# Patient Record
Sex: Female | Born: 1945 | Race: Black or African American | Hispanic: No | Smoking: Never smoker
Health system: Southern US, Community
[De-identification: ages and names within clinical notes are randomized; demographics above are authoritative.]

## PROBLEM LIST (undated history)

## (undated) DIAGNOSIS — E119 Type 2 diabetes mellitus without complications: Secondary | ICD-10-CM

## (undated) DIAGNOSIS — I10 Essential (primary) hypertension: Secondary | ICD-10-CM

## (undated) DIAGNOSIS — M109 Gout, unspecified: Secondary | ICD-10-CM

## (undated) HISTORY — PX: BACK SURGERY: SHX140

## (undated) HISTORY — PX: ABDOMINAL HYSTERECTOMY: SHX81

## (undated) HISTORY — PX: OTHER SURGICAL HISTORY: SHX169

## (undated) HISTORY — PX: THYROID SURGERY: SHX805

## (undated) HISTORY — PX: CHOLECYSTECTOMY: SHX55

---

## 2015-10-21 ENCOUNTER — Encounter (HOSPITAL_COMMUNITY): Payer: Self-pay | Admitting: Family Medicine

## 2015-10-21 ENCOUNTER — Inpatient Hospital Stay (HOSPITAL_COMMUNITY)
Admission: EM | Admit: 2015-10-21 | Discharge: 2015-10-28 | DRG: 871 | Disposition: A | Discharge status: Hospice | Payer: Medicare Other | Attending: Family Medicine | Admitting: Family Medicine

## 2015-10-21 DIAGNOSIS — A419 Sepsis, unspecified organism: Secondary | ICD-10-CM | POA: Diagnosis not present

## 2015-10-21 DIAGNOSIS — Z7982 Long term (current) use of aspirin: Secondary | ICD-10-CM

## 2015-10-21 DIAGNOSIS — M109 Gout, unspecified: Secondary | ICD-10-CM | POA: Diagnosis present

## 2015-10-21 DIAGNOSIS — K5900 Constipation, unspecified: Secondary | ICD-10-CM | POA: Diagnosis present

## 2015-10-21 DIAGNOSIS — Z880 Allergy status to penicillin: Secondary | ICD-10-CM

## 2015-10-21 DIAGNOSIS — E872 Acidosis: Secondary | ICD-10-CM | POA: Diagnosis present

## 2015-10-21 DIAGNOSIS — E876 Hypokalemia: Secondary | ICD-10-CM | POA: Diagnosis present

## 2015-10-21 DIAGNOSIS — L89154 Pressure ulcer of sacral region, stage 4: Secondary | ICD-10-CM | POA: Diagnosis present

## 2015-10-21 DIAGNOSIS — L89159 Pressure ulcer of sacral region, unspecified stage: Secondary | ICD-10-CM | POA: Diagnosis present

## 2015-10-21 DIAGNOSIS — Z66 Do not resuscitate: Secondary | ICD-10-CM | POA: Diagnosis present

## 2015-10-21 DIAGNOSIS — N390 Urinary tract infection, site not specified: Secondary | ICD-10-CM | POA: Diagnosis present

## 2015-10-21 DIAGNOSIS — R6251 Failure to thrive (child): Secondary | ICD-10-CM

## 2015-10-21 DIAGNOSIS — Z8673 Personal history of transient ischemic attack (TIA), and cerebral infarction without residual deficits: Secondary | ICD-10-CM

## 2015-10-21 DIAGNOSIS — Z794 Long term (current) use of insulin: Secondary | ICD-10-CM

## 2015-10-21 DIAGNOSIS — I10 Essential (primary) hypertension: Secondary | ICD-10-CM | POA: Diagnosis present

## 2015-10-21 DIAGNOSIS — Z515 Encounter for palliative care: Secondary | ICD-10-CM | POA: Insufficient documentation

## 2015-10-21 DIAGNOSIS — F0280 Dementia in other diseases classified elsewhere without behavioral disturbance: Secondary | ICD-10-CM | POA: Diagnosis present

## 2015-10-21 DIAGNOSIS — B9620 Unspecified Escherichia coli [E. coli] as the cause of diseases classified elsewhere: Secondary | ICD-10-CM | POA: Diagnosis present

## 2015-10-21 DIAGNOSIS — E11649 Type 2 diabetes mellitus with hypoglycemia without coma: Secondary | ICD-10-CM | POA: Diagnosis present

## 2015-10-21 DIAGNOSIS — Z91040 Latex allergy status: Secondary | ICD-10-CM

## 2015-10-21 DIAGNOSIS — Z955 Presence of coronary angioplasty implant and graft: Secondary | ICD-10-CM

## 2015-10-21 DIAGNOSIS — Z7401 Bed confinement status: Secondary | ICD-10-CM

## 2015-10-21 DIAGNOSIS — Z1619 Resistance to other specified beta lactam antibiotics: Secondary | ICD-10-CM | POA: Diagnosis present

## 2015-10-21 DIAGNOSIS — Z885 Allergy status to narcotic agent status: Secondary | ICD-10-CM

## 2015-10-21 DIAGNOSIS — L8994 Pressure ulcer of unspecified site, stage 4: Secondary | ICD-10-CM

## 2015-10-21 DIAGNOSIS — N179 Acute kidney failure, unspecified: Secondary | ICD-10-CM | POA: Diagnosis present

## 2015-10-21 DIAGNOSIS — G3109 Other frontotemporal dementia: Secondary | ICD-10-CM | POA: Diagnosis present

## 2015-10-21 DIAGNOSIS — I2510 Atherosclerotic heart disease of native coronary artery without angina pectoris: Secondary | ICD-10-CM | POA: Diagnosis present

## 2015-10-21 DIAGNOSIS — L98429 Non-pressure chronic ulcer of back with unspecified severity: Secondary | ICD-10-CM

## 2015-10-21 DIAGNOSIS — L89629 Pressure ulcer of left heel, unspecified stage: Secondary | ICD-10-CM | POA: Diagnosis present

## 2015-10-21 DIAGNOSIS — D649 Anemia, unspecified: Secondary | ICD-10-CM | POA: Diagnosis present

## 2015-10-21 DIAGNOSIS — R627 Adult failure to thrive: Secondary | ICD-10-CM | POA: Diagnosis present

## 2015-10-21 DIAGNOSIS — Z79899 Other long term (current) drug therapy: Secondary | ICD-10-CM

## 2015-10-21 DIAGNOSIS — E119 Type 2 diabetes mellitus without complications: Secondary | ICD-10-CM

## 2015-10-21 HISTORY — DX: Type 2 diabetes mellitus without complications: E11.9

## 2015-10-21 HISTORY — DX: Essential (primary) hypertension: I10

## 2015-10-21 HISTORY — DX: Gout, unspecified: M10.9

## 2015-10-21 LAB — COMPREHENSIVE METABOLIC PANEL
ALBUMIN: 2.7 g/dL — AB (ref 3.5–5.0)
ALK PHOS: 98 U/L (ref 38–126)
ALT: 22 U/L (ref 14–54)
AST: 31 U/L (ref 15–41)
Anion gap: 15 (ref 5–15)
BUN: 11 mg/dL (ref 6–20)
CALCIUM: 9.3 mg/dL (ref 8.9–10.3)
CHLORIDE: 98 mmol/L — AB (ref 101–111)
CO2: 24 mmol/L (ref 22–32)
CREATININE: 1.2 mg/dL — AB (ref 0.44–1.00)
GFR calc non Af Amer: 44 mL/min — ABNORMAL LOW (ref >60)
GFR, EST AFRICAN AMERICAN: 51 mL/min — AB (ref >60)
GLUCOSE: 197 mg/dL — AB (ref 65–99)
Potassium: 3.7 mmol/L (ref 3.5–5.1)
SODIUM: 137 mmol/L (ref 135–145)
Total Bilirubin: 0.8 mg/dL (ref 0.3–1.2)
Total Protein: 6.9 g/dL (ref 6.5–8.1)

## 2015-10-21 LAB — CBC WITH DIFFERENTIAL/PLATELET
Basophils Absolute: 0.1 10*3/uL (ref 0.0–0.1)
Basophils Relative: 0 %
Eosinophils Absolute: 0.1 10*3/uL (ref 0.0–0.7)
Eosinophils Relative: 1 %
HEMATOCRIT: 41.2 % (ref 36.0–46.0)
Hemoglobin: 12.9 g/dL (ref 12.0–15.0)
LYMPHS PCT: 19 %
Lymphs Abs: 3 10*3/uL (ref 0.7–4.0)
MCH: 24.9 pg — ABNORMAL LOW (ref 26.0–34.0)
MCHC: 31.3 g/dL (ref 30.0–36.0)
MCV: 79.4 fL (ref 78.0–100.0)
MONO ABS: 1.3 10*3/uL — AB (ref 0.1–1.0)
MONOS PCT: 8 %
NEUTROS ABS: 11.2 10*3/uL — AB (ref 1.7–7.7)
Neutrophils Relative %: 72 %
PLATELETS: 466 10*3/uL — AB (ref 150–400)
RBC: 5.19 MIL/uL — ABNORMAL HIGH (ref 3.87–5.11)
RDW: 18.4 % — ABNORMAL HIGH (ref 11.5–15.5)
WBC: 15.6 10*3/uL — ABNORMAL HIGH (ref 4.0–10.5)

## 2015-10-21 MED ORDER — SODIUM CHLORIDE 0.9 % IV SOLN
Freq: Once | INTRAVENOUS | Status: AC
  Administered 2015-10-22: 02:00:00 via INTRAVENOUS

## 2015-10-21 NOTE — ED Notes (Addendum)
Pt here for ulcer to sacrum area. sts oozing, bleeding and foul smell. sts worsening over the past month. sts to the bone. Pt does not speak and hasn't x 8 months. Per daughter unsure if she has had a stroke or Alzheimer. sts she says random words. Pt grunting.

## 2015-10-21 NOTE — ED Provider Notes (Signed)
CSN: [id number]     Arrival date & time 10/21/15  1756 History  By signing my name below, I, Lisa Burch, attest that this documentation has been prepared under the direction and in the presence of Shon Batonourtney F Horton, MD. Electronically Signed: Bethel BornBritney Burch, ED Scribe. 10/22/2015. 1:10 AM Chief Complaint  Patient presents with  . Skin Ulcer   Level V caveat secondary to the condition of the patient   The history is provided by a relative. The history is limited by the condition of the patient. No language interpreter was used.   Lisa Burch is a 70 y.o. female with history of heart disease, DM, and HTN who presents to the Emergency Department complaining of a worsening ulcer at the sacrum with onset 09/22/15.  The pt has been bed bound for the last 8 months and also has ulcers at her right lower leg and left heel. The pt has not been seen by wound care and does not have home health care. She was seen by her PCP and referred to the ED. Daughter denies fever or urinary symptoms. The pt is non-verbal at baseline, her family states that it is unknown if she has dementia but she does have a "frontal lobe problem".   PCP:  Iva LentoJamie Stapp Young  Past Medical History  Diagnosis Date  . Hypertension   . Diabetes mellitus without complication (HCC)   . Gout    Past Surgical History  Procedure Laterality Date  . Thyroid surgery    . Abdominal hysterectomy    . Back surgery     History reviewed. No pertinent family history. Social History  Substance Use Topics  . Smoking status: Never Smoker   . Smokeless tobacco: None  . Alcohol Use: None   OB History    No data available     Review of Systems  Unable to perform ROS: Patient nonverbal   Allergies  Latex; Morphine and related; and Penicillins  Home Medications   Prior to Admission medications   Medication Sig Start Date End Date Taking? Authorizing Provider  allopurinol (ZYLOPRIM) 100 MG tablet Take 100 mg by mouth daily.    Yes Historical Provider, MD  ALPRAZolam Prudy Feeler(XANAX) 0.5 MG tablet Take 0.5 mg by mouth as needed for anxiety.   Yes Historical Provider, MD  aspirin 81 MG chewable tablet Chew 81 mg by mouth daily.   Yes Historical Provider, MD  carvedilol (COREG) 6.25 MG tablet Take 6.25 mg by mouth 2 (two) times daily with a meal.   Yes Historical Provider, MD  cetirizine (ZYRTEC) 10 MG tablet Take 10 mg by mouth daily.   Yes Historical Provider, MD  clotrimazole-betamethasone (LOTRISONE) cream Apply 1 application topically 2 (two) times daily.   Yes Historical Provider, MD  colchicine 0.6 MG tablet Take 0.6 mg by mouth daily.   Yes Historical Provider, MD  cyanocobalamin 500 MCG tablet Take 500 mcg by mouth daily.   Yes Historical Provider, MD  docusate sodium (COLACE) 100 MG capsule Take 100 mg by mouth daily.   Yes Historical Provider, MD  HYDROcodone-acetaminophen (NORCO) 7.5-325 MG tablet Take 1 tablet by mouth every 6 (six) hours.   Yes Historical Provider, MD  insulin detemir (LEVEMIR) 100 UNIT/ML injection Inject 40 Units into the skin 2 (two) times daily.   Yes Historical Provider, MD  insulin regular (NOVOLIN R,HUMULIN R) 100 units/mL injection Inject 1-15 Units into the skin 3 (three) times daily before meals. Sliding scale   Yes Historical Provider, MD  isosorbide mononitrate (IMDUR) 30 MG 24 hr tablet Take 30 mg by mouth daily.   Yes Historical Provider, MD  Multiple Vitamin (MULTIVITAMIN WITH MINERALS) TABS tablet Take 1 tablet by mouth daily.   Yes Historical Provider, MD  Omega-3 Fatty Acids (FISH OIL) 1000 MG CAPS Take 1,000 mg by mouth daily.   Yes Historical Provider, MD  Vitamin D, Ergocalciferol, (DRISDOL) 50000 units CAPS capsule Take 50,000 Units by mouth every 7 (seven) days. On mondays   Yes Historical Provider, MD   BP 125/101 mmHg  Pulse 117  Temp(Src) 98.5 F (36.9 C) (Oral)  Resp 16  Ht  (1.803 m)  Wt 308 lb (139.708 kg)  BMI 42.98 kg/m2  SpO2 95% Physical Exam   Constitutional: No distress.  Chronically ill-appearing, no acute distress  HENT:  Head: Normocephalic and atraumatic.  Mouth/Throat: Oropharynx is clear and moist.  Eyes: Pupils are equal, round, and reactive to light.  Cardiovascular: Regular rhythm and normal heart sounds.   No murmur heard. Tachycardia  Pulmonary/Chest: Effort normal and breath sounds normal. No respiratory distress. She has no wheezes.  Abdominal: Soft. Bowel sounds are normal. There is no tenderness. There is no rebound.  Neurological: She is alert.  Tracks with her eyes, nonverbal, does not follow commands  Skin:  6 x 4 cm stage IV sacral decubitus ulcer to the bone, scant drainage noted, no surrounding erythema 2 x 2 centimeter ulceration with eschar noted over the right lateral calf 1.5 by 1.5 cm hemorrhagic bulla left medial heel  Psychiatric: She has a normal mood and affect.  Nursing note and vitals reviewed.   ED Course  Procedures (including critical care time) DIAGNOSTIC STUDIES: Oxygen Saturation is 95% on RA,  normal by my interpretation.    COORDINATION OF CARE: 11:24 PM Discussed treatment plan which includes lab work with the patient's family at bedside and they agreed to plan.  12:58 AM-Consult complete with Dr. Toniann Fail (Hospitalist). Patient case explained and discussed. Agrees to admit patient for further evaluation and treatment. Call ended at 1:00 AM   Labs Review Labs Reviewed  CBC WITH DIFFERENTIAL/PLATELET - Abnormal; Notable for the following:    WBC 15.6 (*)    RBC 5.19 (*)    MCH 24.9 (*)    RDW 18.4 (*)    Platelets 466 (*)    Neutro Abs 11.2 (*)    Monocytes Absolute 1.3 (*)    All other components within normal limits  COMPREHENSIVE METABOLIC PANEL - Abnormal; Notable for the following:    Chloride 98 (*)    Glucose, Bld 197 (*)    Creatinine, Ser 1.20 (*)    Albumin 2.7 (*)    GFR calc non Af Amer 44 (*)    GFR calc Af Amer 51 (*)    All other components within  normal limits  URINALYSIS, ROUTINE W REFLEX MICROSCOPIC (NOT AT [facility]) - Abnormal; Notable for the following:    Color, Urine AMBER (*)    APPearance CLOUDY (*)    Hgb urine dipstick TRACE (*)    Bilirubin Urine MODERATE (*)    Ketones, ur 15 (*)    Leukocytes, UA LARGE (*)    All other components within normal limits  URINE MICROSCOPIC-ADD ON - Abnormal; Notable for the following:    Squamous Epithelial / LPF 0-5 (*)    Bacteria, UA MANY (*)    Casts HYALINE CASTS (*)    Crystals CA OXALATE CRYSTALS (*)    All other  components within normal limits  I-STAT CG4 LACTIC ACID, ED - Abnormal; Notable for the following:    Lactic Acid, Venous 2.43 (*)    All other components within normal limits  URINE CULTURE  CULTURE, BLOOD (ROUTINE X 2)  CULTURE, BLOOD (ROUTINE X 2)    Imaging Review Dg Sacrum/coccyx  10/22/2015  CLINICAL DATA:  Decubitus ulceration at the level of the sacrum. Initial encounter. EXAM: SACRUM AND COCCYX - 2+ VIEW COMPARISON:  None. FINDINGS: The patient's known decubitus ulceration is not well characterized on radiograph. Extension to the coccyx cannot be excluded, as the coccyx is difficult to fully characterize on this study. The colon is largely filled with stool. IMPRESSION: 1. Known decubitus ulceration not well characterized. Extension of infection to the coccyx cannot be excluded on this study. 2. Large amount of stool noted in the colon, compatible with constipation. Electronically Signed   By: Roanna Raider M.D.   On: 10/22/2015 00:30   I personally reviewed and evaluated these images and lab results as a part of my medical decision-making.   EKG Interpretation None      MDM   Final diagnoses:  Stage 4 decubitus ulcer (HCC)  Failure to thrive (0-17)  UTI (lower urinary tract infection)    Patient presents with concern for sacral decubitus ulcers. Decline over the last year and failure to thrive. She is chronically ill-appearing. Mildly tachycardic.  Otherwise afebrile. Mucous members are dry. She is given a normal saline bolus. Lactate is 2.43 minimally elevated. Leukocytosis to 15. X-ray of the sacrum cannot exclude infection. Patient was given vancomycin. Urinalysis also shows likely infection. Urine culture pending. Patient given Rocephin for UTI. Blood and urine cultures pending. She is DO NOT RESUSCITATE.  Discussed admission with Dr. Toniann Fail.  I personally performed the services described in this documentation, which was scribed in my presence. The recorded information has been reviewed and is accurate.    Shon Baton, MD 10/22/15 [phone]

## 2015-10-22 ENCOUNTER — Encounter (HOSPITAL_COMMUNITY): Payer: Self-pay | Admitting: Internal Medicine

## 2015-10-22 ENCOUNTER — Observation Stay (HOSPITAL_COMMUNITY): Payer: Medicare Other

## 2015-10-22 ENCOUNTER — Emergency Department (HOSPITAL_COMMUNITY): Payer: Medicare Other

## 2015-10-22 DIAGNOSIS — L8994 Pressure ulcer of unspecified site, stage 4: Secondary | ICD-10-CM

## 2015-10-22 DIAGNOSIS — G3109 Other frontotemporal dementia: Secondary | ICD-10-CM | POA: Diagnosis not present

## 2015-10-22 DIAGNOSIS — E119 Type 2 diabetes mellitus without complications: Secondary | ICD-10-CM

## 2015-10-22 DIAGNOSIS — L89159 Pressure ulcer of sacral region, unspecified stage: Secondary | ICD-10-CM | POA: Diagnosis present

## 2015-10-22 DIAGNOSIS — F0280 Dementia in other diseases classified elsewhere without behavioral disturbance: Secondary | ICD-10-CM | POA: Diagnosis present

## 2015-10-22 DIAGNOSIS — I2510 Atherosclerotic heart disease of native coronary artery without angina pectoris: Secondary | ICD-10-CM | POA: Diagnosis present

## 2015-10-22 LAB — URINALYSIS, ROUTINE W REFLEX MICROSCOPIC
GLUCOSE, UA: NEGATIVE mg/dL
Ketones, ur: 15 mg/dL — AB
Nitrite: NEGATIVE
PROTEIN: NEGATIVE mg/dL
Specific Gravity, Urine: 1.026 (ref 1.005–1.030)
pH: 6 (ref 5.0–8.0)

## 2015-10-22 LAB — I-STAT CG4 LACTIC ACID, ED: Lactic Acid, Venous: 2.43 mmol/L (ref 0.5–2.0)

## 2015-10-22 LAB — BASIC METABOLIC PANEL
ANION GAP: 10 (ref 5–15)
BUN: 11 mg/dL (ref 6–20)
CHLORIDE: 101 mmol/L (ref 101–111)
CO2: 25 mmol/L (ref 22–32)
Calcium: 8.4 mg/dL — ABNORMAL LOW (ref 8.9–10.3)
Creatinine, Ser: 1.09 mg/dL — ABNORMAL HIGH (ref 0.44–1.00)
GFR calc Af Amer: 57 mL/min — ABNORMAL LOW (ref >60)
GFR calc non Af Amer: 49 mL/min — ABNORMAL LOW (ref >60)
GLUCOSE: 146 mg/dL — AB (ref 65–99)
POTASSIUM: 2.8 mmol/L — AB (ref 3.5–5.1)
Sodium: 136 mmol/L (ref 135–145)

## 2015-10-22 LAB — CBC
HEMATOCRIT: 37.9 % (ref 36.0–46.0)
HEMOGLOBIN: 11.6 g/dL — AB (ref 12.0–15.0)
MCH: 24.2 pg — AB (ref 26.0–34.0)
MCHC: 30.6 g/dL (ref 30.0–36.0)
MCV: 79 fL (ref 78.0–100.0)
Platelets: 406 10*3/uL — ABNORMAL HIGH (ref 150–400)
RBC: 4.8 MIL/uL (ref 3.87–5.11)
RDW: 18.5 % — ABNORMAL HIGH (ref 11.5–15.5)
WBC: 13.9 10*3/uL — ABNORMAL HIGH (ref 4.0–10.5)

## 2015-10-22 LAB — LACTIC ACID, PLASMA: LACTIC ACID, VENOUS: 1.5 mmol/L (ref 0.5–2.0)

## 2015-10-22 LAB — URINE MICROSCOPIC-ADD ON

## 2015-10-22 LAB — GLUCOSE, CAPILLARY
GLUCOSE-CAPILLARY: 139 mg/dL — AB (ref 65–99)
GLUCOSE-CAPILLARY: 179 mg/dL — AB (ref 65–99)
Glucose-Capillary: 171 mg/dL — ABNORMAL HIGH (ref 65–99)
Glucose-Capillary: 129 mg/dL — ABNORMAL HIGH (ref 65–99)
Glucose-Capillary: 94 mg/dL (ref 65–99)

## 2015-10-22 MED ORDER — ASPIRIN 81 MG PO CHEW
81.0000 mg | CHEWABLE_TABLET | Freq: Every day | ORAL | Status: DC
  Administered 2015-10-22 – 2015-10-27 (×5): 81 mg via ORAL
  Filled 2015-10-22 (×5): qty 1

## 2015-10-22 MED ORDER — DOCUSATE SODIUM 100 MG PO CAPS
100.0000 mg | ORAL_CAPSULE | Freq: Every day | ORAL | Status: DC
  Administered 2015-10-22 – 2015-10-23 (×2): 100 mg via ORAL
  Filled 2015-10-22 (×2): qty 1

## 2015-10-22 MED ORDER — VITAMIN D (ERGOCALCIFEROL) 1.25 MG (50000 UNIT) PO CAPS
50000.0000 [IU] | ORAL_CAPSULE | ORAL | Status: DC
  Filled 2015-10-22 (×2): qty 1

## 2015-10-22 MED ORDER — VITAMIN B-12 1000 MCG PO TABS
500.0000 ug | ORAL_TABLET | Freq: Every day | ORAL | Status: DC
Start: 2015-10-22 — End: 2015-10-28
  Administered 2015-10-22 – 2015-10-26 (×4): 500 ug via ORAL
  Administered 2015-10-27: 10:00:00 via ORAL
  Filled 2015-10-22 (×5): qty 1

## 2015-10-22 MED ORDER — VANCOMYCIN HCL 10 G IV SOLR
1500.0000 mg | INTRAVENOUS | Status: DC
  Administered 2015-10-22 – 2015-10-27 (×6): 1500 mg via INTRAVENOUS
  Filled 2015-10-22 (×7): qty 1500

## 2015-10-22 MED ORDER — CARVEDILOL 6.25 MG PO TABS
6.2500 mg | ORAL_TABLET | Freq: Two times a day (BID) | ORAL | Status: DC
  Administered 2015-10-22 – 2015-10-28 (×14): 6.25 mg via ORAL
  Filled 2015-10-22 (×14): qty 1

## 2015-10-22 MED ORDER — INSULIN ASPART 100 UNIT/ML ~~LOC~~ SOLN
0.0000 [IU] | Freq: Three times a day (TID) | SUBCUTANEOUS | Status: DC
  Administered 2015-10-22: 1 [IU] via SUBCUTANEOUS
  Administered 2015-10-22 – 2015-10-25 (×4): 2 [IU] via SUBCUTANEOUS
  Administered 2015-10-26 (×2): 1 [IU] via SUBCUTANEOUS
  Administered 2015-10-27: 2 [IU] via SUBCUTANEOUS
  Administered 2015-10-27: 1 [IU] via SUBCUTANEOUS

## 2015-10-22 MED ORDER — ADULT MULTIVITAMIN W/MINERALS CH
1.0000 | ORAL_TABLET | Freq: Every day | ORAL | Status: DC
Start: 2015-10-22 — End: 2015-10-28
  Administered 2015-10-22 – 2015-10-27 (×5): 1 via ORAL
  Filled 2015-10-22 (×5): qty 1

## 2015-10-22 MED ORDER — INSULIN DETEMIR 100 UNIT/ML ~~LOC~~ SOLN
40.0000 [IU] | Freq: Two times a day (BID) | SUBCUTANEOUS | Status: DC
  Administered 2015-10-22 (×2): 40 [IU] via SUBCUTANEOUS
  Filled 2015-10-22 (×4): qty 0.4

## 2015-10-22 MED ORDER — FISH OIL 1000 MG PO CAPS: 1000.0000 mg | ORAL_CAPSULE | Freq: Every day | ORAL | Status: DC

## 2015-10-22 MED ORDER — COLCHICINE 0.6 MG PO TABS
0.6000 mg | ORAL_TABLET | Freq: Every day | ORAL | Status: DC
  Administered 2015-10-22 – 2015-10-27 (×5): 0.6 mg via ORAL
  Filled 2015-10-22 (×5): qty 1

## 2015-10-22 MED ORDER — ONDANSETRON HCL 4 MG PO TABS: 4.0000 mg | ORAL_TABLET | Freq: Four times a day (QID) | ORAL | Status: DC | PRN

## 2015-10-22 MED ORDER — ISOSORBIDE MONONITRATE ER 30 MG PO TB24
30.0000 mg | ORAL_TABLET | Freq: Every day | ORAL | Status: DC
  Administered 2015-10-22 – 2015-10-28 (×6): 30 mg via ORAL
  Filled 2015-10-22 (×6): qty 1

## 2015-10-22 MED ORDER — OMEGA-3-ACID ETHYL ESTERS 1 G PO CAPS
1.0000 g | ORAL_CAPSULE | Freq: Two times a day (BID) | ORAL | Status: DC
  Administered 2015-10-22 – 2015-10-27 (×8): 1 g via ORAL
  Filled 2015-10-22 (×9): qty 1

## 2015-10-22 MED ORDER — LORATADINE 10 MG PO TABS
10.0000 mg | ORAL_TABLET | Freq: Every day | ORAL | Status: DC
Start: 2015-10-22 — End: 2015-10-28
  Administered 2015-10-22 – 2015-10-27 (×5): 10 mg via ORAL
  Filled 2015-10-22 (×5): qty 1

## 2015-10-22 MED ORDER — SODIUM CHLORIDE 0.9 % IV BOLUS (SEPSIS)
1000.0000 mL | Freq: Once | INTRAVENOUS | Status: AC
  Administered 2015-10-22: 1000 mL via INTRAVENOUS

## 2015-10-22 MED ORDER — COLLAGENASE 250 UNIT/GM EX OINT
TOPICAL_OINTMENT | Freq: Every day | CUTANEOUS | Status: DC
  Administered 2015-10-22 – 2015-10-28 (×7): via TOPICAL
  Filled 2015-10-22: qty 30

## 2015-10-22 MED ORDER — DEXTROSE 5 % IV SOLN
1.0000 g | Freq: Once | INTRAVENOUS | Status: AC
  Administered 2015-10-22: 1 g via INTRAVENOUS
  Filled 2015-10-22: qty 10

## 2015-10-22 MED ORDER — VANCOMYCIN HCL IN DEXTROSE 1-5 GM/200ML-% IV SOLN
1000.0000 mg | Freq: Once | INTRAVENOUS | Status: AC
  Administered 2015-10-22: 1000 mg via INTRAVENOUS
  Filled 2015-10-22: qty 200

## 2015-10-22 MED ORDER — ACETAMINOPHEN 325 MG PO TABS
650.0000 mg | ORAL_TABLET | Freq: Four times a day (QID) | ORAL | Status: DC | PRN
  Administered 2015-10-28: 650 mg via ORAL
  Filled 2015-10-22: qty 2

## 2015-10-22 MED ORDER — ALLOPURINOL 100 MG PO TABS
100.0000 mg | ORAL_TABLET | Freq: Every day | ORAL | Status: DC
  Administered 2015-10-22 – 2015-10-27 (×5): 100 mg via ORAL
  Filled 2015-10-22 (×5): qty 1

## 2015-10-22 MED ORDER — ALPRAZOLAM 0.5 MG PO TABS: 0.5000 mg | ORAL_TABLET | Freq: Three times a day (TID) | ORAL | Status: DC | PRN

## 2015-10-22 MED ORDER — ONDANSETRON HCL 4 MG/2ML IJ SOLN
4.0000 mg | Freq: Four times a day (QID) | INTRAMUSCULAR | Status: DC | PRN
  Filled 2015-10-22: qty 2

## 2015-10-22 MED ORDER — SODIUM CHLORIDE 0.9 % IV SOLN
INTRAVENOUS | Status: DC
  Administered 2015-10-22 – 2015-10-23 (×3): via INTRAVENOUS

## 2015-10-22 MED ORDER — HYDROCODONE-ACETAMINOPHEN 7.5-325 MG PO TABS
1.0000 | ORAL_TABLET | Freq: Four times a day (QID) | ORAL | Status: DC
Start: 2015-10-22 — End: 2015-10-26
  Administered 2015-10-22 – 2015-10-26 (×13): 1 via ORAL
  Filled 2015-10-22 (×13): qty 1

## 2015-10-22 MED ORDER — DEXTROSE 5 % IV SOLN
2.0000 g | Freq: Two times a day (BID) | INTRAVENOUS | Status: DC
  Administered 2015-10-22 – 2015-10-27 (×11): 2 g via INTRAVENOUS
  Filled 2015-10-22 (×13): qty 2

## 2015-10-22 MED ORDER — ACETAMINOPHEN 650 MG RE SUPP: 650.0000 mg | Freq: Four times a day (QID) | RECTAL | Status: DC | PRN

## 2015-10-22 NOTE — Evaluation (Signed)
Physical Therapy Evaluation/ discharge Patient Details Name: Lisa Burch MRN: [id number] DOB: 06/08/1946 Today's Date: 10/22/2015   History of Present Illness  70 yo admitted from home with progressive sacral wound. Pt bed bound and nonverbal with decline in mobility for 5 months. PMHx: DM, HTN, heart disease  Clinical Impression  Pt nonverbal and only moving eyes throughout eval. Niece present to provide PLOF and home setup. Dgtr has been primary caregiver for years for pt and has been physically assisting pt with all needs. Pt with very limited PROM all joints, no active motion or strength noted throughout and is total care for mobility and ADLs with progressive wounds on sacrum and left shin. Pt at baseline total assist and not appropriate for acute therapy with recommendation for SNF at D/C. No further needs with family educated for positioning and ROM to decrease burden of care, also made aware of D/C recommendations and signing off. Family aware and agreeable.     Follow Up Recommendations SNF     Equipment Recommendations  Other (comment) (hoyer lift should pt return home)    Recommendations for Other Services       Precautions / Restrictions Precautions Precautions: Fall Precaution Comments: multiple wounds      Mobility  Bed Mobility               General bed mobility comments: pt with grimace with attempt to move and total assist to roll  Transfers                    Ambulation/Gait                Stairs            Wheelchair Mobility    Modified Rankin (Stroke Patients Only)       Balance                                             Pertinent Vitals/Pain Pain Assessment:  (PAINAD= 1)    Home Living Family/patient expects to be discharged to:: Private residence Living Arrangements: Children Available Help at Discharge: Family;Available 24 hours/day Type of Home: House       Home Layout: One level Home  Equipment: Walker - 2 wheels;Bedside commode;Tub bench;Wheelchair - manual;Hospital bed      Prior Function Level of Independence: Needs assistance   Gait / Transfers Assistance Needed: pt has been total assist for transfers for at least 5 months with only limited mobility prior to that. Dgtr and son physically pick her up and put her in a WC or stand her with her feet on theirs to walk her into the bathroom 8'  ADL's / Homemaking Assistance Needed: total assist for ADLs        Hand Dominance        Extremity/Trunk Assessment   Upper Extremity Assessment: RUE deficits/detail;LUE deficits/detail RUE Deficits / Details: no AROM only PROM: elbow flexion grossly 15 degree ROM with preference at 90degree flexion, very limited internal external rotation of shoulder, shoulder flexion grossly 30-70 degrees, no grip noted     LUE Deficits / Details: elbow ROM grossly 10 degrees with fluid pocket noted, unable to assess shoulder due to pt positioning and grimace   Lower Extremity Assessment: RLE deficits/detail;LLE deficits/detail RLE Deficits / Details: knee flexion grossly 20 degrees, no appreciable hip ABduct/ADD, hip flexion unable   to achieve 90degrees and also limited to grossly 20 degrees. no plantarflexion and very limited dorsiflexion LLE Deficits / Details: knee flexion grossly 20 degrees, no appreciable hip ABduct/ADD, hip flexion unable to achieve 90degrees and also limited to grossly 20 degrees. no plantarflexion and very limited dorsiflexion  Cervical / Trunk Assessment:  (unable to assess in supine)  Communication   Communication: Expressive difficulties;Receptive difficulties  Cognition Arousal/Alertness: Awake/alert Behavior During Therapy: Flat affect Overall Cognitive Status: History of cognitive impairments - at baseline                      General Comments      Exercises        Assessment/Plan    PT Assessment All further PT needs can be met in the  next venue of care  PT Diagnosis Generalized weakness;Altered mental status   PT Problem List Decreased strength;Decreased range of motion;Decreased mobility;Decreased cognition  PT Treatment Interventions     PT Goals (Current goals can be found in the Care Plan section) Acute Rehab PT Goals PT Goal Formulation: All assessment and education complete, DC therapy    Frequency     Barriers to discharge        Co-evaluation               End of Session   Activity Tolerance: Patient limited by lethargy Patient left: in bed;with call bell/phone within reach;with family/visitor present           Time: 1127-1139 PT Time Calculation (min) (ACUTE ONLY): 12 min   Charges:   PT Evaluation $PT Eval Moderate Complexity: 1 Procedure     PT G CodesMelford Aase 10/22/2015, 12:53 PM Elwyn Reach, [place]

## 2015-10-22 NOTE — Progress Notes (Signed)
Physical Therapy Wound Treatment Patient Details  Name: Lisa Burch MRN: [id number] Date of Birth: 06/15/1946  Today's Date: 10/22/2015 Time: 8887-5797 Time Calculation (min): 39 min  Subjective  Subjective: Pt aphasic Patient and Family Stated Goals: Pt's niece present and agreeable to working toward cleaning up wound. Date of Onset:  (Prior to hospitalization)  Pain Score:  No signs of pain.  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 Pt has 3 inch full thickness pressure ulcer to sacral area (Active)  Dressing Type ABD;Barrier Film (skin prep);Gauze (Comment);Moist to dry 10/22/2015 11:57 AM  Dressing Changed;Clean;Dry;Intact 10/22/2015 11:57 AM  Dressing Change Frequency Daily 10/22/2015 11:57 AM  State of Healing Eschar 10/22/2015 11:57 AM  Site / Wound Assessment Black;Brown;Red;Pink 10/22/2015 11:57 AM  % Wound base Red or Granulating 20% 10/22/2015 11:57 AM  % Wound base Yellow 0% 10/22/2015 11:57 AM  % Wound base Black 80% 10/22/2015 11:57 AM  % Wound base Other (Comment) 0% 10/22/2015 11:57 AM  Peri-wound Assessment Intact 10/22/2015 11:57 AM  Wound Length (cm) 9 cm 10/22/2015 11:57 AM  Wound Width (cm) 7 cm 10/22/2015 11:57 AM  Wound Depth (cm) 3 cm 10/22/2015 11:57 AM  Tunneling (cm) 2 10/22/2015  4:00 AM  Margins Unattached edges (unapproximated) 10/22/2015 11:57 AM  Drainage Amount Minimal 10/22/2015 11:57 AM  Drainage Description Purulent;Odor 10/22/2015 11:57 AM  Treatment Debridement (Selective);Hydrotherapy (Pulse lavage);Packing (Saline gauze) 10/22/2015 11:57 AM   Santyl applied to wound bed prior to applying dressing.    Hydrotherapy Pulsed lavage therapy - wound location: sacrum Pulsed Lavage with Suction (psi): 8 psi Pulsed Lavage with Suction - Normal Saline Used: 1000 mL Pulsed Lavage Tip: Tip with splash shield Selective Debridement Selective Debridement - Location: sacrum Selective Debridement  - Tools Used: Forceps;Scissors Selective Debridement - Tissue Removed: brown, black necrotic tissue   Wound Assessment and Plan  Wound Therapy - Assess/Plan/Recommendations Wound Therapy - Clinical Statement: Pt presents to hydrotherapy with unstageable sacral wound. Can benefit from hydrotherapy for removal of necrotic tissue and to decrease bioburden.  Wound Therapy - Functional Problem List: Decr sitting Factors Delaying/Impairing Wound Healing: Diabetes Mellitus;Incontinence;Immobility;Multiple medical problems;Polypharmacy Hydrotherapy Plan: Debridement;Dressing change;Patient/family education;Pulsatile lavage with suction Wound Therapy - Frequency: 6X / week Wound Therapy - Follow Up Recommendations: Other (comment) (Dependent on family plans for dc destination.) Wound Plan: See above  Wound Therapy Goals- Improve the function of patient's integumentary system by progressing the wound(s) through the phases of wound healing (inflammation - proliferation - remodeling) by: Decrease Necrotic Tissue to: 30 Decrease Necrotic Tissue - Progress: Goal set today Increase Granulation Tissue to: 70 Increase Granulation Tissue - Progress: Goal set today  Goals will be updated until maximal potential achieved or discharge criteria met.  Discharge criteria: when goals achieved, discharge from hospital, MD decision/surgical intervention, no progress towards goals, refusal/missing three consecutive treatments without notification or medical reason.  GP     Barth Trella 10/22/2015, 1:36 PM [place]

## 2015-10-22 NOTE — NC FL2 (Signed)
[place] MEDICAID FL2 LEVEL OF CARE SCREENING TOOL     IDENTIFICATION  Patient Name: Lisa Burch Birthdate: [birth year] Sex: female Admission Date (Current Location): 10/21/2015  [facility] and [place] Number:  Producer, television/film/video and Address:  [facility], [address] [place], [place] [address]      Provider Number: [id number]  Attending Physician Name and Address:  Hollice Espy, MD  Relative Name and Phone Number:       Current Level of Care: Hospital Recommended Level of Care: Skilled Nursing Facility Prior Approval Number:    Date Approved/Denied:   PASRR Number:    Discharge Plan: SNF    Current Diagnoses: Patient Active Problem List   Diagnosis Date Noted  . Sacral decubitus ulcer 10/22/2015  . 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 Diabetes mellitus type 2, controlled (HCC) 10/22/2015  . CAD (coronary artery disease) 10/22/2015  . Stage 4 decubitus ulcer (HCC)     Orientation RESPIRATION BLADDER Height & Weight      (Unable to assess)  Normal Incontinent Weight: 92.67 kg (204 lb 4.8 oz) Height:   (180.3 cm)  BEHAVIORAL SYMPTOMS/MOOD NEUROLOGICAL BOWEL NUTRITION STATUS   (NONE)  (NONE) Continent Diet (Heart Healthy/Carb Modified)  AMBULATORY STATUS COMMUNICATION OF NEEDS Skin   Total Care   PU Stage and Appropriate Care, Other (Comment) (Stage IV sacrum, Stage II lateral foot, Unstageable of right lateral leg,)   PU Stage 2 Dressing:  (PRN)   PU Stage 4 Dressing: Daily               Personal Care Assistance Level of Assistance  Bathing, Feeding, Dressing Bathing Assistance: Maximum assistance Feeding assistance: Maximum assistance Dressing Assistance: Maximum assistance     Functional Limitations Info  Sight, Hearing, Speech Sight Info: Adequate Hearing Info: Adequate Speech Info: Impaired    SPECIAL CARE FACTORS FREQUENCY                       Contractures Contractures Info: Not present     Additional Factors Info  Code Status, Allergies, Insulin Sliding Scale Code Status Info: DNR Allergies Info: Lactose Intolerance (Gi), Latex, Morphine And Related, Penicillins   Insulin Sliding Scale Info: 3/day       Current Medications (10/22/2015):  This is the current hospital active medication list Current Facility-Administered Medications  Medication Dose Route Frequency Provider Last Rate Last Dose  . 0.9 %  sodium chloride infusion   Intravenous Continuous Hollice Espy, MD 100 mL/hr at 10/22/15 1326    . acetaminophen (TYLENOL) tablet 650 mg  650 mg Oral Q6H PRN Eduard Clos, MD       Or  . acetaminophen (TYLENOL) suppository 650 mg  650 mg Rectal Q6H PRN Eduard Clos, MD      . allopurinol (ZYLOPRIM) tablet 100 mg  100 mg Oral Daily Eduard Clos, MD   100 mg at 10/22/15 1046  . ALPRAZolam Prudy Feeler) tablet 0.5 mg  0.5 mg Oral Q8H PRN Eduard Clos, MD      . aspirin chewable tablet 81 mg  81 mg Oral Daily Eduard Clos, MD   81 mg at 10/22/15 1046  . carvedilol (COREG) tablet 6.25 mg  6.25 mg Oral BID WC Eduard Clos, MD   6.25 mg at 10/22/15 1840  . colchicine tablet 0.6 mg  0.6 mg Oral Daily Eduard Clos, MD   0.6 mg at 10/22/15 1046  .  collagenase (SANTYL) ointment   Topical Daily Hollice EspySendil K Krishnan, MD      . docusate sodium (COLACE) capsule 100 mg  100 mg Oral Daily Eduard ClosArshad N Kakrakandy, MD   100 mg at 10/22/15 1046  . HYDROcodone-acetaminophen (NORCO) 7.5-325 MG per tablet 1 tablet  1 tablet Oral Q6H Eduard ClosArshad N Kakrakandy, MD   Stopped at 10/22/15 1326  . insulin aspart (novoLOG) injection 0-9 Units  0-9 Units Subcutaneous TID WC Eduard ClosArshad N Kakrakandy, MD   1 Units at 10/22/15 1840  . insulin detemir (LEVEMIR) injection 40 Units  40 Units Subcutaneous BID Eduard ClosArshad N Kakrakandy, MD   40 Units at 10/22/15 1046  . isosorbide mononitrate (IMDUR) 24 hr tablet 30 mg  30 mg Oral Daily Eduard ClosArshad N Kakrakandy, MD   30 mg at 10/22/15 1045  .  loratadine (CLARITIN) tablet 10 mg  10 mg Oral Daily Eduard ClosArshad N Kakrakandy, MD   10 mg at 10/22/15 1046  . multivitamin with minerals tablet 1 tablet  1 tablet Oral Daily Eduard ClosArshad N Kakrakandy, MD   1 tablet at 10/22/15 1046  . omega-3 acid ethyl esters (LOVAZA) capsule 1 g  1 g Oral BID Eduard ClosArshad N Kakrakandy, MD   1 g at 10/22/15 1046  . ondansetron (ZOFRAN) tablet 4 mg  4 mg Oral Q6H PRN Eduard ClosArshad N Kakrakandy, MD       Or  . ondansetron [facility](ZOFRAN) injection 4 mg  4 mg Intravenous Q6H PRN Eduard ClosArshad N Kakrakandy, MD      . vitamin B-12 (CYANOCOBALAMIN) tablet 500 mcg  500 mcg Oral Daily Eduard ClosArshad N Kakrakandy, MD   500 mcg at 10/22/15 1046  . [START ON 10/28/2015] Vitamin D (Ergocalciferol) (DRISDOL) capsule 50,000 Units  50,000 Units Oral Q7 days Eduard ClosArshad N Kakrakandy, MD         Discharge Medications: Please see discharge summary for a list of discharge medications.  Relevant Imaging Results:  Relevant Lab Results:   Additional Information SSN: [id number], Patient currently receiving hydrotherapy for wounds.  Venita Lickampbell, Armondo Cech B, LCSW

## 2015-10-22 NOTE — Consult Note (Addendum)
WOC wound consult note Reason for Consult: sacral and leg wounds. Patient total care from home, bed bound, aphasic.  Son and niece at bedside.  Wound type: Unstageable Pressure Injury right sacrum/R ischium: 7cm x 4cm x 3cm; 80% soft black non viable tissue/20% dark red tissue DTPI (Deep tissue pressure injury) left medial heel: 3cm x 2.5cm x 0cm; dark, purple, blood filled blister Unstageable Pressure Injury right posterior calf: 2.5cm x 3.5cm x 0cm; 100% black stable eschar Pressure Ulcer POA: Yes x 3 Measurement: see above  Wound bed: see above  Drainage (amount, consistency, odor) moderate to heavy from the sacral/ischial area, none from other sites.  Periwound: intact  Dressing procedure/placement/frequency: Will add LALM for pressure redistribution; will need this at home most definitely Will add PT for hydrotherapy and enzyme to clean away non viable tissue and then reassess wound for needed treatment at that point. Will add Prevalon boots to offload the heels Wounds on the heel and leg are stable, paint areas with betadine only, allow to air dry to keep stable and intact.  Add nutritional consult, pending swallow evaluation as well for wound healing.  Will need added protein intake for these wounds.  Explained all to son and niece at the bedside. Based on DC disposition she may need HHRN for wound care, air mattress, WC cushion with limited time up out of bed. Updated Dr. Rito EhrlichKrishnan,  PT for hydrotherapy arrived at bedside.   WOC team will follow along with you for weekly wound assessments.  Please notify me of any acute changes in the wounds or any new areas of concerns Armen PickupMelody Xiamara Hulet RN,CWOCN [phone]

## 2015-10-22 NOTE — Evaluation (Signed)
Clinical/Bedside Swallow Evaluation Patient Details  Name: Lisa Burch MRN: [id number] Date of Birth: 05/03/1946  Today's Date: 10/22/2015 Time: SLP Start Time (ACUTE ONLY): 1352 SLP Stop Time (ACUTE ONLY): 1407 SLP Time Calculation (min) (ACUTE ONLY): 15 min  Past Medical History:  Past Medical History  Diagnosis Date  . Hypertension   . Diabetes mellitus without complication (HCC)   . Gout    Past Surgical History:  Past Surgical History  Procedure Laterality Date  . Thyroid surgery    . Abdominal hysterectomy    . Back surgery    . Angioplasty    . Cholecystectomy     HPI:  70 yo admitted from home with progressive sacral wound. Pt bed bound and nonverbal with decline in mobility for 5 months. PMHx: DM, HTN, heart disease   Assessment / Plan / Recommendation Clinical Impression  Pt has a cognitively-based oral dysphagia marked primarily by oral holding and prolonged bolus manipulation. She was not responsive to tactile stimulation provided by SLP via dry spoon, but alternating liquid washes were effective at eliciting a swallow response. No overt s/s of aspiration are observed. Per family report, pt appears to be at her baseline level of function. Education provided to sister about recommendations and strategies to facilitate oral dysphagia. Will adjust diet to Dys 1 textures and thin liquids. SLP to sign off, please reorder is needed.    Aspiration Risk  Mild aspiration risk;Moderate aspiration risk;Risk for inadequate nutrition/hydration    Diet Recommendation Dysphagia 1 (Puree);Thin liquid   Liquid Administration via: Straw Medication Administration: Crushed with puree Supervision: Staff to assist with self feeding;Full supervision/cueing for compensatory strategies Compensations: Slow rate;Small sips/bites;Minimize environmental distractions;Follow solids with liquid Postural Changes: Seated upright at 90 degrees;Remain upright for at least 30 minutes after po intake     Other  Recommendations Oral Care Recommendations: Oral care BID;Other (Comment) (oral care after meals)   Follow up Recommendations  24 hour supervision/assistance    Frequency and Duration            Prognosis Prognosis for Safe Diet Advancement: Guarded Barriers to Reach Goals: Cognitive deficits      Swallow Study   General HPI: 70 yo admitted from home with progressive sacral wound. Pt bed bound and nonverbal with decline in mobility for 5 months. PMHx: DM, HTN, heart disease Type of Study: Bedside Swallow Evaluation Previous Swallow Assessment: none in chart Diet Prior to this Study: Regular;Thin liquids Temperature Spikes Noted: No Respiratory Status: Room air History of Recent Intubation: No Behavior/Cognition: Alert;Cooperative;Requires cueing Oral Cavity Assessment: Other (comment) (appears to have food residue, difficult to visualize/clear) Oral Care Completed by SLP: Other (Comment) (attempted to clear, pt holding mouth shut) Oral Cavity - Dentition: Other (Comment) (difficult to visualize) Self-Feeding Abilities: Total assist Patient Positioning: Upright in bed Baseline Vocal Quality: Other (comment) (UTA) Volitional Cough: Cognitively unable to elicit Volitional Swallow: Unable to elicit    Oral/Motor/Sensory Function     Ice Chips Ice chips: Not tested   Thin Liquid Thin Liquid: Impaired Presentation: Straw Oral Phase Functional Implications: Oral holding    Nectar Thick Nectar Thick Liquid: Not tested   Honey Thick Honey Thick Liquid: Not tested   Puree Puree: Impaired Presentation: Spoon Oral Phase Functional Implications: Oral holding   Solid   GO   Solid: Not tested    Functional Assessment Tool Used: skilled clinical judgment Functional Limitations: Swallowing Swallow Current Status (O9629): At least 60 percent but less than 80 percent impaired, limited or  restricted Swallow Goal Status [phone](G8997): At least 60 percent but less than 80 percent  impaired, limited or restricted Swallow Discharge Status [phone]([id number]): At least 60 percent but less than 80 percent impaired, limited or restricted    Maxcine HamLaura Paiewonsky, M.A. CCC-SLP [phone]  Maxcine Hamaiewonsky, Mailey Landstrom 10/22/2015,2:15 PM

## 2015-10-22 NOTE — Progress Notes (Signed)
PROGRESS NOTE  Lisa Burch WUJ:[id number] DOB: August 27, 1945 DOA: 10/21/2015 PCP: No primary care provider on file.  HPI/Recap of past 24 hours:  patient is a 70 year old female with past medical history of CVA and frontal lobe dementia which has left her bedbound  And nonverbal as well as history of diabetes mellitus and brought in by family for  Worsening ulcer on her backside.  On admission, patient found to have elevated white blood cell count and elevated lactic acid level is 2.43. Patient started on IV antibiotics.     the following day, white count improving with lactic acid level down to normal limits. Patient unable to give me a review of systems. Seen by wound  Care who are recommending hydrotherapy by physical therapy while hospitalized.  Assessment/Plan: Principal Problem:   Sepsis secondary to Sacral decubitus ulcer  Present on admission, stage IV: Continue IV antibiotics. Patient meets criteria for sepsis given lactic acidosis, leukocytosis, tachycardia and decubitus ulcer source. Sepsis looks to have stabilized. Continue IV fluids Active Problems:   Frontal lobe dementia/history of CVA: Patient's family is actually been quite involved in her care and the plan is for them to go home with her.   Diabetes mellitus type 2, controlled (HCC): CBGs under 200   CAD (coronary artery disease)   Code Status:  DO NOT RESUSCITATE   Family Communication:  Niece at the bedside   Disposition Plan:  Home with family once infection better stabilized and long-term wound care plans made.    Consultants:   Wound care   Procedures:   hydrotherapy by physical therapy   Antibiotics:   IV vancomycin and Rocephin 3/14-present    Objective: BP 134/69 mmHg  Pulse 89  Temp(Src) 99.1 F (37.3 C) (Oral)  Resp 18  Ht  (1.803 m)  Wt 92.67 kg (204 lb 4.8 oz)  BMI 28.51 kg/m2  SpO2 93%  Intake/Output Summary (Last 24 hours) at 10/22/15 1704 Last data filed at 10/22/15 0319  Gross  per 24 hour  Intake   1000 ml  Output      0 ml  Net   1000 ml   Filed Weights   10/21/15 1825 10/22/15 0428  Weight: 139.708 kg (308 lb) 92.67 kg (204 lb 4.8 oz)    Exam:   General:   Opens eyes to voice, does not follow commands   Cardiovascular:  Regular rate and rhythm, S1-S2   Respiratory:  Clear to auscultation bilaterally   Abdomen:  Soft, nontender, nondistended, positive bowel sounds   Musculoskeletal:  No clubbing or cyanosis, trace edema. The curious ulcers noted on lower extremities    Data Reviewed: Basic Metabolic Panel:  Recent Labs Lab 10/21/15 1905 10/22/15 0437  NA 137 136  K 3.7 2.8*  CL 98* 101  CO2 24 25  GLUCOSE 197* 146*  BUN 11 11  CREATININE 1.20* 1.09*  CALCIUM 9.3 8.4*   Liver Function Tests:  Recent Labs Lab 10/21/15 1905  AST 31  ALT 22  ALKPHOS 98  BILITOT 0.8  PROT 6.9  ALBUMIN 2.7*   No results for input(s): LIPASE, AMYLASE in the last 168 hours. No results for input(s): AMMONIA in the last 168 hours. CBC:  Recent Labs Lab 10/21/15 1905 10/22/15 0437  WBC 15.6* 13.9*  NEUTROABS 11.2*  --   HGB 12.9 11.6*  HCT 41.2 37.9  MCV 79.4 79.0  PLT 466* 406*   Cardiac Enzymes:   No results for input(s): CKTOTAL, CKMB, CKMBINDEX, TROPONINI in  the last 168 hours. BNP (last 3 results) No results for input(s): BNP in the last 8760 hours.  ProBNP (last 3 results) No results for input(s): PROBNP in the last 8760 hours.  CBG:  Recent Labs Lab 10/22/15 0424 10/22/15 0814 10/22/15 1229  GLUCAP 139* 171* 179*    No results found for this or any previous visit (from the past 240 hour(s)).   Studies: Dg Sacrum/coccyx  10/22/2015  CLINICAL DATA:  Decubitus ulceration at the level of the sacrum. Initial encounter. EXAM: SACRUM AND COCCYX - 2+ VIEW COMPARISON:  None. FINDINGS: The patient's known decubitus ulceration is not well characterized on radiograph. Extension to the coccyx cannot be excluded, as the coccyx is  difficult to fully characterize on this study. The colon is largely filled with stool. IMPRESSION: 1. Known decubitus ulceration not well characterized. Extension of infection to the coccyx cannot be excluded on this study. 2. Large amount of stool noted in the colon, compatible with constipation. Electronically Signed   By: Roanna Raider M.D.   On: 10/22/2015 00:30   Ct Pelvis Wo Contrast  10/22/2015  CLINICAL DATA:  Worsening sacral decubitus ulceration, with oozing and bleeding. Initial encounter. EXAM: CT PELVIS WITHOUT CONTRAST TECHNIQUE: Multidetector CT imaging of the pelvis was performed following the standard protocol without intravenous contrast. COMPARISON:  Sacrococcygeal radiographs performed earlier today at 12:25 a.m. FINDINGS: A focal decubitus ulceration is noted overlying the right side of the coccyx, with surrounding soft tissue inflammation extending to the coccyx. No definite osseous erosion is seen to suggest osteomyelitis, though evaluation for osteomyelitis is limited on CT. No abscess is seen. Additional diffuse soft tissue inflammation is noted along the lateral abdominal wall and both flanks. Degenerative change is noted along the lower lumbar spine, with loss of the disc space at L5-S1. The sacroiliac joints are grossly unremarkable. Visualized small and large bowel loops are grossly unremarkable. The appendix remains normal in caliber, without evidence of appendicitis. The bladder is mildly distended and grossly unremarkable. The patient is status post hysterectomy. No suspicious adnexal masses are seen. Diffuse calcification is noted along the abdominal aorta and its branches. IMPRESSION: 1. Focal decubitus ulceration overlies the right side of the coccyx, with surrounding soft tissue inflammation extending to the coccyx. No definite osseous erosion seen to suggest osteomyelitis, though evaluation for osteomyelitis is limited on CT. 2. No evidence of abscess. 3. Additional diffuse  soft tissue inflammation along the lateral abdominal wall and both flanks. 4. Diffuse vascular calcifications seen. 5. Degenerative change at the lower lumbar spine. Electronically Signed   By: Roanna Raider M.D.   On: 10/22/2015 03:05    Scheduled Meds: . allopurinol  100 mg Oral Daily  . aspirin  81 mg Oral Daily  . carvedilol  6.25 mg Oral BID WC  . colchicine  0.6 mg Oral Daily  . collagenase   Topical Daily  . docusate sodium  100 mg Oral Daily  . HYDROcodone-acetaminophen  1 tablet Oral Q6H  . insulin aspart  0-9 Units Subcutaneous TID WC  . insulin detemir  40 Units Subcutaneous BID  . isosorbide mononitrate  30 mg Oral Daily  . loratadine  10 mg Oral Daily  . multivitamin with minerals  1 tablet Oral Daily  . omega-3 acid ethyl esters  1 g Oral BID  . cyanocobalamin  500 mcg Oral Daily  . [START ON 10/28/2015] Vitamin D (Ergocalciferol)  50,000 Units Oral Q7 days    Continuous Infusions: . sodium chloride 100  mL/hr at 10/22/15 1326     Time spent: 15 minutes   Hollice EspyKRISHNAN,Romaine Neville K  [facility] Pager [phone] . If 7PM-7AM, please contact night-coverage at [email or web address], password [email or web address] 10/22/2015, 5:04 PM

## 2015-10-22 NOTE — Care Management Obs Status (Addendum)
MEDICARE OBSERVATION STATUS NOTIFICATION   Patient Details  Name: Lisa PotashDiana Burch MRN: [id number] Date of Birth: 04/23/1946   Medicare Observation Status Notification Given:  Yes (Per Dr Jacky KindleAronson obs unless , Ct scan shows 2 days of IV ABX medically needed )  Given and explained to patient's son at bedside   Kingsley PlanWile, Luisantonio Adinolfi Marie, RN 10/22/2015, 8:03 AM

## 2015-10-22 NOTE — Progress Notes (Signed)
Pharmacy Antibiotic Note  Lisa PotashDiana Burch is a 70 y.o. female admitted on 10/21/2015 with worsening ulcer on her backside.  Pharmacy has been consulted for Vancomycin and Zosyn dosing for cellulitis. WBC elevated at 13.9. Afebrile. CrCl ~ 55-60 mL/min   Plan: -Vancomycin 1500 mg IV every 24 hours.  Goal trough 10-15 mcg/mL. -Cefepime 2 gm IV Q 12 hours  -Monitor CBC, renal fx, cultures and clinical progress -VT at SS   Height: 5\' 11"  (180.3 cm) Weight: 204 lb 4.8 oz (92.67 kg) IBW/kg (Calculated) : 70.8  Temp (24hrs), Avg:98.7 F (37.1 C), Min:98.3 F (36.8 C), Max:99.1 F (37.3 C)   Recent Labs Lab 10/21/15 1905 10/22/15 0004 10/22/15 0437 10/22/15 1156  WBC 15.6*  --  13.9*  --   CREATININE 1.20*  --  1.09*  --   LATICACIDVEN  --  2.43*  --  1.5    Estimated Creatinine Clearance: 58.6 mL/min (by C-G formula based on Cr of 1.09).    Allergies  Allergen Reactions  . Lactose Intolerance (Gi) Diarrhea  . Latex Other (See Comments)    Unknown   . Morphine And Related Other (See Comments)    unknown  . Penicillins Other (See Comments)    unknown    Antimicrobials this admission: 3/14 Vancomycin>> 3/14 Cefepime >>   Dose adjustments this admission: None   Microbiology results: 3/14 UCx>> 3/14 BCx2>>   Thank you for allowing pharmacy to be a part of this patient's care.  Vinnie LevelBenjamin Ted Goodner, PharmD., BCPS Clinical Pharmacist Pager [phone]

## 2015-10-22 NOTE — H&P (Signed)
[facility] History and Physical  Lisa Burch ZOX:[id number] DOB: [birth year] DOA: 10/21/2015  Referring physician: Dr. Wilkie Aye. PCP: No primary care provider on file. Patient is brought from out of town. Specialists: None.  Chief Complaint: Decubitus ulcer.  History obtained from patient's daughter.  HPI: Lisa Burch is a 70 y.o. female with history of frontal lobe dementia, diabetes mellitus, CAD was brought to the ER as patient has have rapidly progressing sacral ligament ulcer. Patient's daughter states that sacral decubitus was noticed last month which has progressed fast. Patient also has a small decubitus ulcer on the left heel and right calf. Patient's primary care physician referred her to the ER for further management. On exam that is stage IV decubitus on the sacral area. Patient is not febrile. Patient is demented and does not provide any history. As per family patient did not have any nausea vomiting diarrhea or chest pain.   Review of Systems: As presented in the history of presenting illness, rest negative.  Past Medical History  Diagnosis Date  . Hypertension   . Diabetes mellitus without complication (HCC)   . Gout    Past Surgical History  Procedure Laterality Date  . Thyroid surgery    . Abdominal hysterectomy    . Back surgery    . Angioplasty    . Cholecystectomy     Social History:  reports that she has never smoked. She does not have any smokeless tobacco history on file. She reports that she does not drink alcohol or use illicit drugs. Where does patient live Home.  Can patient participate in ADLs? No.  Allergies  Allergen Reactions  . Latex Other (See Comments)    Unknown   . Morphine And Related Other (See Comments)    unknown  . Penicillins Other (See Comments)    unknown    Family History:  Family History  Problem Relation Age of Onset  . Stroke Mother   . Diabetes Mellitus II Neg Hx       Prior to Admission medications    Medication Sig Start Date End Date Taking? Authorizing Provider  allopurinol (ZYLOPRIM) 100 MG tablet Take 100 mg by mouth daily.   Yes Historical Provider, MD  ALPRAZolam Prudy Feeler) 0.5 MG tablet Take 0.5 mg by mouth as needed for anxiety.   Yes Historical Provider, MD  aspirin 81 MG chewable tablet Chew 81 mg by mouth daily.   Yes Historical Provider, MD  carvedilol (COREG) 6.25 MG tablet Take 6.25 mg by mouth 2 (two) times daily with a meal.   Yes Historical Provider, MD  cetirizine (ZYRTEC) 10 MG tablet Take 10 mg by mouth daily.   Yes Historical Provider, MD  clotrimazole-betamethasone (LOTRISONE) cream Apply 1 application topically 2 (two) times daily.   Yes Historical Provider, MD  colchicine 0.6 MG tablet Take 0.6 mg by mouth daily.   Yes Historical Provider, MD  cyanocobalamin 500 MCG tablet Take 500 mcg by mouth daily.   Yes Historical Provider, MD  docusate sodium (COLACE) 100 MG capsule Take 100 mg by mouth daily.   Yes Historical Provider, MD  HYDROcodone-acetaminophen (NORCO) 7.5-325 MG tablet Take 1 tablet by mouth every 6 (six) hours.   Yes Historical Provider, MD  insulin detemir (LEVEMIR) 100 UNIT/ML injection Inject 40 Units into the skin 2 (two) times daily.   Yes Historical Provider, MD  insulin regular (NOVOLIN R,HUMULIN R) 100 units/mL injection Inject 1-15 Units into the skin 3 (three) times daily before meals. Sliding scale  Yes Historical Provider, MD  isosorbide mononitrate (IMDUR) 30 MG 24 hr tablet Take 30 mg by mouth daily.   Yes Historical Provider, MD  Multiple Vitamin (MULTIVITAMIN WITH MINERALS) TABS tablet Take 1 tablet by mouth daily.   Yes Historical Provider, MD  Omega-3 Fatty Acids (FISH OIL) 1000 MG CAPS Take 1,000 mg by mouth daily.   Yes Historical Provider, MD  Vitamin D, Ergocalciferol, (DRISDOL) 50000 units CAPS capsule Take 50,000 Units by mouth every 7 (seven) days. On mondays   Yes Historical Provider, MD    Physical Exam: Filed Vitals:   10/21/15  2300 10/21/15 2320 10/22/15 0000 10/22/15 0125  BP: 121/107  112/76 122/60  Pulse:   96 85  Temp:  98.3 F (36.8 C)    TempSrc:  Rectal    Resp:    16  Height:      Weight:      SpO2: 93%  92% 96%     General:  Moderately built and nourished.   Eyes: Anicteric no pallor.   ENT: No discharge from the ears eyes nose or mouth.   Neck: No mass felt.   Cardiovascular: S1 and S2 heard.  Respiratory: No rhonchi or crepitations.   Abdomen: Soft nontender bowel sounds present.  Skin: Sacral decubitus ulcer stage IV and has also a bleb on the left heel and also on the right calf.  Musculoskeletal: See skin description.  Psychiatric: Patient is demented.  Neurologic: Patient is demented.  Labs on Admission:  Basic Metabolic Panel:  Recent Labs Lab 10/21/15 1905  NA 137  K 3.7  CL 98*  CO2 24  GLUCOSE 197*  BUN 11  CREATININE 1.20*  CALCIUM 9.3   Liver Function Tests:  Recent Labs Lab 10/21/15 1905  AST 31  ALT 22  ALKPHOS 98  BILITOT 0.8  PROT 6.9  ALBUMIN 2.7*   No results for input(s): LIPASE, AMYLASE in the last 168 hours. No results for input(s): AMMONIA in the last 168 hours. CBC:  Recent Labs Lab 10/21/15 1905  WBC 15.6*  NEUTROABS 11.2*  HGB 12.9  HCT 41.2  MCV 79.4  PLT 466*   Cardiac Enzymes: No results for input(s): CKTOTAL, CKMB, CKMBINDEX, TROPONINI in the last 168 hours.  BNP (last 3 results) No results for input(s): BNP in the last 8760 hours.  ProBNP (last 3 results) No results for input(s): PROBNP in the last 8760 hours.  CBG: No results for input(s): GLUCAP in the last 168 hours.  Radiological Exams on Admission: Dg Sacrum/coccyx  10/22/2015  CLINICAL DATA:  Decubitus ulceration at the level of the sacrum. Initial encounter. EXAM: SACRUM AND COCCYX - 2+ VIEW COMPARISON:  None. FINDINGS: The patient's known decubitus ulceration is not well characterized on radiograph. Extension to the coccyx cannot be excluded, as the  coccyx is difficult to fully characterize on this study. The colon is largely filled with stool. IMPRESSION: 1. Known decubitus ulceration not well characterized. Extension of infection to the coccyx cannot be excluded on this study. 2. Large amount of stool noted in the colon, compatible with constipation. Electronically Signed   By: Roanna Raider M.D.   On: 10/22/2015 00:30     Assessment/Plan Principal Problem:   Sacral decubitus ulcer Active Problems:   Frontal lobe dementia   Diabetes mellitus type 2, controlled (HCC)   CAD (coronary artery disease)   1. Stage IV sacral decubitus ulcer with possible infection - I have ordered CT pelvis without contrast. For now patient has  been placed on empiric antibiotics. Wound team consult. 2. Diabetes is type II - patient on Levemir 40 units twice a day along which I have placed patient on sliding scale coverage. 3. CAD status post stenting - denies any chest pain. 4. History of dementia - will continue present medications.   DVT Prophylaxis Lovenox.  Code Status: DO NOT RESUSCITATE.  Family Communication: Patient's daughter.  Disposition Plan: Admit to inpatient.    Lisa Burch N. [facility] Pager [phone].  If 7PM-7AM, please contact night-coverage [email or web address] Password [email or web address] 10/22/2015, 2:25 AM

## 2015-10-22 NOTE — ED Notes (Signed)
Pt transported to CT ?

## 2015-10-22 NOTE — Clinical Social Work Note (Signed)
Clinical Social Work Assessment  Patient Details  Name: Lisa PotashDiana Burch MRN: [id number] Date of Birth: 10/23/1945  Date of referral:  10/22/15               Reason for consult:  Discharge Planning, Facility Placement                Permission sought to share information with:  Facility Medical sales representativeContact Representative, Family Supports Permission granted to share information::  Yes, Verbal Permission Granted  Name::     Data processing managertephanie  Agency::  SNF  Relationship::     Contact Information:     Housing/Transportation Living arrangements for the past 2 months:  Single Family Home Source of Information:  Adult Children Patient Interpreter Needed:  None Criminal Activity/Legal Involvement Pertinent to Current Situation/Hospitalization:  No - Comment as needed Significant Relationships:  Adult Children, Siblings, Other Family Members Lives with:  Adult Children Do you feel safe going back to the place where you live?  Yes Need for family participation in patient care:  Yes (Comment)  Care giving concerns:  The patient's daughter Judeth CornfieldStephanie states that she would like to see what long term SNF options exist for the patient.   Social Worker assessment / plan:  CSW spoke with the patient's daughter by phone to complete assessment. The patient's daughter Judeth CornfieldStephanie reports that the [place] lives in [place] with her, but [facility]. Per Judeth CornfieldStephanie, the patient [place] many family members in [place]. Judeth CornfieldStephanie states that the family may still decide to bring the patient home at time of discharge with hospice services. CSW explained long term SNF placement process and answered the daughter's questions. CSW will followup with available bed offers.  Employment status:  Retired, Disabled (Comment on whether or not currently receiving Disability) Insurance information:  Armed forces operational officerMedicare, [place] Recommend[place]:  Skilled Nursing Facility Information / Referral to community resources:   Other (Comment Required) (CSW will explore long term care options per daughter's request.)  Patient/Family's Response to care: The patient's family is happy with the care the patient has received.  Patient/Family's Understanding of and Emotional Response to Diagnosis, Current Treatment, and Prognosis:  The patient's daughter has a fair understanding of the reason for the patient's admission and post DC needs.   Emotional Assessment Appearance:  Appears stated age Attitude/Demeanor/Rapport:  Unable to Assess Affect (typically observed):  Unable to Assess Orientation:    Alcohol / Substance use:  Not Applicable Psych involvement (Current and /or in the community):  No (Comment)  Discharge Needs  Concerns to be addressed:  Discharge Planning Concerns Readmission within the last 30 days:  No Current discharge risk:  Cognitively Impaired, Chronically ill, Physical Impairment Barriers to Discharge:  Continued Medical Work up   Venita Lickampbell, Romelia Bromell B, LCSW 10/22/2015, 3:14 PM

## 2015-10-23 DIAGNOSIS — N179 Acute kidney failure, unspecified: Secondary | ICD-10-CM | POA: Diagnosis present

## 2015-10-23 DIAGNOSIS — L89154 Pressure ulcer of sacral region, stage 4: Secondary | ICD-10-CM | POA: Diagnosis present

## 2015-10-23 DIAGNOSIS — Z79899 Other long term (current) drug therapy: Secondary | ICD-10-CM | POA: Diagnosis not present

## 2015-10-23 DIAGNOSIS — E876 Hypokalemia: Secondary | ICD-10-CM | POA: Diagnosis present

## 2015-10-23 DIAGNOSIS — D649 Anemia, unspecified: Secondary | ICD-10-CM | POA: Diagnosis present

## 2015-10-23 DIAGNOSIS — I10 Essential (primary) hypertension: Secondary | ICD-10-CM | POA: Diagnosis present

## 2015-10-23 DIAGNOSIS — A419 Sepsis, unspecified organism: Secondary | ICD-10-CM | POA: Diagnosis present

## 2015-10-23 DIAGNOSIS — N390 Urinary tract infection, site not specified: Secondary | ICD-10-CM | POA: Diagnosis present

## 2015-10-23 DIAGNOSIS — I2510 Atherosclerotic heart disease of native coronary artery without angina pectoris: Secondary | ICD-10-CM | POA: Diagnosis present

## 2015-10-23 DIAGNOSIS — A409 Streptococcal sepsis, unspecified: Secondary | ICD-10-CM

## 2015-10-23 DIAGNOSIS — Z7401 Bed confinement status: Secondary | ICD-10-CM | POA: Diagnosis not present

## 2015-10-23 DIAGNOSIS — E872 Acidosis: Secondary | ICD-10-CM | POA: Diagnosis present

## 2015-10-23 DIAGNOSIS — Z8673 Personal history of transient ischemic attack (TIA), and cerebral infarction without residual deficits: Secondary | ICD-10-CM | POA: Diagnosis not present

## 2015-10-23 DIAGNOSIS — L89150 Pressure ulcer of sacral region, unstageable: Secondary | ICD-10-CM | POA: Diagnosis not present

## 2015-10-23 DIAGNOSIS — Z885 Allergy status to narcotic agent status: Secondary | ICD-10-CM | POA: Diagnosis not present

## 2015-10-23 DIAGNOSIS — Z91040 Latex allergy status: Secondary | ICD-10-CM | POA: Diagnosis not present

## 2015-10-23 DIAGNOSIS — E11649 Type 2 diabetes mellitus with hypoglycemia without coma: Secondary | ICD-10-CM | POA: Diagnosis present

## 2015-10-23 DIAGNOSIS — Z794 Long term (current) use of insulin: Secondary | ICD-10-CM | POA: Diagnosis not present

## 2015-10-23 DIAGNOSIS — L89159 Pressure ulcer of sacral region, unspecified stage: Secondary | ICD-10-CM | POA: Diagnosis not present

## 2015-10-23 DIAGNOSIS — Z880 Allergy status to penicillin: Secondary | ICD-10-CM | POA: Diagnosis not present

## 2015-10-23 DIAGNOSIS — Z515 Encounter for palliative care: Secondary | ICD-10-CM | POA: Diagnosis not present

## 2015-10-23 DIAGNOSIS — M109 Gout, unspecified: Secondary | ICD-10-CM | POA: Diagnosis present

## 2015-10-23 DIAGNOSIS — K5900 Constipation, unspecified: Secondary | ICD-10-CM | POA: Diagnosis present

## 2015-10-23 DIAGNOSIS — Z955 Presence of coronary angioplasty implant and graft: Secondary | ICD-10-CM | POA: Diagnosis not present

## 2015-10-23 DIAGNOSIS — G3109 Other frontotemporal dementia: Secondary | ICD-10-CM | POA: Diagnosis present

## 2015-10-23 DIAGNOSIS — Z7982 Long term (current) use of aspirin: Secondary | ICD-10-CM | POA: Diagnosis not present

## 2015-10-23 DIAGNOSIS — Z66 Do not resuscitate: Secondary | ICD-10-CM | POA: Diagnosis present

## 2015-10-23 DIAGNOSIS — L89629 Pressure ulcer of left heel, unspecified stage: Secondary | ICD-10-CM | POA: Diagnosis present

## 2015-10-23 DIAGNOSIS — Z1619 Resistance to other specified beta lactam antibiotics: Secondary | ICD-10-CM | POA: Diagnosis present

## 2015-10-23 DIAGNOSIS — E119 Type 2 diabetes mellitus without complications: Secondary | ICD-10-CM | POA: Diagnosis not present

## 2015-10-23 DIAGNOSIS — R627 Adult failure to thrive: Secondary | ICD-10-CM | POA: Diagnosis present

## 2015-10-23 DIAGNOSIS — B9620 Unspecified Escherichia coli [E. coli] as the cause of diseases classified elsewhere: Secondary | ICD-10-CM | POA: Diagnosis present

## 2015-10-23 LAB — CBC
HCT: 41.1 % (ref 36.0–46.0)
HEMOGLOBIN: 12.7 g/dL (ref 12.0–15.0)
MCH: 24.9 pg — AB (ref 26.0–34.0)
MCHC: 30.9 g/dL (ref 30.0–36.0)
MCV: 80.6 fL (ref 78.0–100.0)
PLATELETS: 368 10*3/uL (ref 150–400)
RBC: 5.1 MIL/uL (ref 3.87–5.11)
RDW: 18.7 % — ABNORMAL HIGH (ref 11.5–15.5)
WBC: 14.4 10*3/uL — ABNORMAL HIGH (ref 4.0–10.5)

## 2015-10-23 LAB — BASIC METABOLIC PANEL
Anion gap: 10 (ref 5–15)
BUN: 7 mg/dL (ref 6–20)
CHLORIDE: 107 mmol/L (ref 101–111)
CO2: 24 mmol/L (ref 22–32)
CREATININE: 0.54 mg/dL (ref 0.44–1.00)
Calcium: 8.5 mg/dL — ABNORMAL LOW (ref 8.9–10.3)
GFR calc Af Amer: >60 mL/min (ref >60)
GFR calc non Af Amer: >60 mL/min (ref >60)
GLUCOSE: 114 mg/dL — AB (ref 65–99)
POTASSIUM: 2.9 mmol/L — AB (ref 3.5–5.1)
Sodium: 141 mmol/L (ref 135–145)

## 2015-10-23 LAB — GLUCOSE, CAPILLARY
GLUCOSE-CAPILLARY: 112 mg/dL — AB (ref 65–99)
GLUCOSE-CAPILLARY: 139 mg/dL — AB (ref 65–99)
GLUCOSE-CAPILLARY: 61 mg/dL — AB (ref 65–99)
Glucose-Capillary: 17 mg/dL — CL (ref 65–99)
Glucose-Capillary: 69 mg/dL (ref 65–99)
Glucose-Capillary: 94 mg/dL (ref 65–99)

## 2015-10-23 MED ORDER — DEXTROSE 50 % IV SOLN
INTRAVENOUS | Status: AC
  Filled 2015-10-23: qty 50

## 2015-10-23 MED ORDER — DEXTROSE 50 % IV SOLN: 50.0000 mL | INTRAVENOUS | Status: DC | PRN

## 2015-10-23 MED ORDER — INSULIN DETEMIR 100 UNIT/ML ~~LOC~~ SOLN
30.0000 [IU] | Freq: Two times a day (BID) | SUBCUTANEOUS | Status: DC
  Filled 2015-10-23 (×2): qty 0.3

## 2015-10-23 MED ORDER — DEXTROSE 50 % IV SOLN
50.0000 mL | Freq: Once | INTRAVENOUS | Status: AC
Start: 2015-10-23 — End: 2015-10-23
  Administered 2015-10-23: 50 mL via INTRAVENOUS

## 2015-10-23 MED ORDER — INSULIN DETEMIR 100 UNIT/ML ~~LOC~~ SOLN
20.0000 [IU] | Freq: Two times a day (BID) | SUBCUTANEOUS | Status: DC
  Filled 2015-10-23 (×3): qty 0.2

## 2015-10-23 MED ORDER — GLUCERNA SHAKE PO LIQD
237.0000 mL | Freq: Two times a day (BID) | ORAL | Status: DC
  Administered 2015-10-23 – 2015-10-27 (×7): 237 mL via ORAL

## 2015-10-23 MED ORDER — POTASSIUM CHLORIDE CRYS ER 20 MEQ PO TBCR
40.0000 meq | EXTENDED_RELEASE_TABLET | Freq: Two times a day (BID) | ORAL | Status: AC
  Administered 2015-10-23 (×2): 40 meq via ORAL
  Filled 2015-10-23 (×2): qty 2

## 2015-10-23 MED ORDER — POTASSIUM CHLORIDE IN NACL 40-0.9 MEQ/L-% IV SOLN
INTRAVENOUS | Status: DC
  Administered 2015-10-23 – 2015-10-25 (×2): 75 mL/h via INTRAVENOUS
  Administered 2015-10-25 – 2015-10-27 (×4): 50 mL/h via INTRAVENOUS
  Filled 2015-10-23 (×9): qty 1000

## 2015-10-23 MED ORDER — PRO-STAT SUGAR FREE PO LIQD
30.0000 mL | Freq: Three times a day (TID) | ORAL | Status: DC
  Administered 2015-10-23 – 2015-10-27 (×11): 30 mL via ORAL
  Filled 2015-10-23 (×11): qty 30

## 2015-10-23 MED ORDER — POTASSIUM CHLORIDE CRYS ER 20 MEQ PO TBCR: 40.0000 meq | EXTENDED_RELEASE_TABLET | Freq: Two times a day (BID) | ORAL | Status: DC

## 2015-10-23 NOTE — Progress Notes (Signed)
PROGRESS NOTE  Lisa PotashDiana Crist ZOX:[id number] DOB: 10/18/1945 DOA: 10/21/2015 PCP: No primary care provider on file.  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  On admission, patient found to be in sepsis with elevated white blood cell count and elevated lactic acid level is 2.43. Patient started on IV antibiotics.    Sepsis stabilized by hospital day 2. Patient seen by wound care and started on hydrotherapy. Today, patient about the same. Unable to give me a review of systems. Had hypoglycemic event this morning which quickly rebounded.  Assessment/Plan: Principal Problem:   Sepsis secondary to Sacral decubitus ulcer  Present on admission, stage IV./part unstageable: Continue IV antibiotics. Patient meets criteria for sepsis given lactic acidosis, leukocytosis, tachycardia and decubitus ulcer source. Sepsis looks to have stabilized. Continue IV fluids.  White blood cell count still elevated although somewhat improved.  Active Problems:   Frontal lobe dementia/history of CVA: Patient's family is actually been quite involved in her care and the plan is for them to go home with her versus skilled nursing.   Diabetes mellitus type 2, controlled (HCC) with episode of hypoglycemia: On large amount of Levemir twice a day. Have decreased dose from 40-20 twice a day.   CAD (coronary artery disease)   Code Status:  DO NOT RESUSCITATE   Family Communication:  Son at the bedside   Disposition Plan:  Home with family versus short-term skilled nursing   Consultants:   Wound care   Procedures:   hydrotherapy by physical therapy   Antibiotics:   IV vancomycin and Rocephin 3/14-present    Objective: BP 139/65 mmHg  Pulse 85  Temp(Src) 98.7 F (37.1 C) (Oral)  Resp 16  Ht  5\' 11"  (1.803 m)  Wt 92.67 kg (204 lb 4.8 oz)  BMI 28.51 kg/m2  SpO2 94%  Intake/Output Summary (Last 24 hours) at 10/23/15 1001 Last data filed at 10/23/15 0955  Gross per 24 hour  Intake 4653.34 ml  Output      0 ml  Net 4653.34 ml   Filed Weights   10/21/15 1825 10/22/15 0428  Weight: 139.708 kg (308 lb) 92.67 kg (204 lb 4.8 oz)    Exam:   General:   Unchanged, nonverbal, yes open and tracks  Cardiovascular:  Regular rate and rhythm, S1-S2   Respiratory:  Clear to auscultation bilaterally  Abdomen:  Soft, nontender, nondistended, positive bowel sounds   Buttocks: Unstageable decubitus ulcer with some drainage & hole  Musculoskeletal:  No clubbing or cyanosis, trace edema. Smaller decub ulcers noted on lower extremities    Data Reviewed: Basic Metabolic Panel:  Recent Labs Lab 10/21/15 1905 10/22/15 0437  NA 137 136  K 3.7 2.8*  CL 98* 101  CO2 24 25  GLUCOSE 197* 146*  BUN 11 11  CREATININE 1.20* 1.09*  CALCIUM 9.3 8.4*   Liver Function Tests:  Recent Labs Lab 10/21/15 1905  AST 31  ALT 22  ALKPHOS 98  BILITOT 0.8  PROT 6.9  ALBUMIN 2.7*   No results for input(s): LIPASE, AMYLASE in the last 168 hours. No results for input(s): AMMONIA in the last 168 hours. CBC:  Recent Labs Lab 10/21/15 1905 10/22/15 0437 10/23/15 0820  WBC 15.6* 13.9* 14.4*  NEUTROABS 11.2*  --   --  HGB 12.9 11.6* 12.7  HCT 41.2 37.9 41.1  MCV 79.4 79.0 80.6  PLT 466* 406* 368   Cardiac Enzymes:   No results for input(s): CKTOTAL, CKMB, CKMBINDEX, TROPONINI in the last 168 hours. BNP (last 3 results) No results for input(s): BNP in the last 8760 hours.  ProBNP (last 3 results) No results for input(s): PROBNP in the last 8760 hours.  CBG:  Recent Labs Lab 10/22/15 1650 10/22/15 2203 10/23/15 0755 10/23/15 0758 10/23/15 0826  GLUCAP 129* 94 24* 17* 112*    No results found for this or any previous visit (from the past 240 hour(s)).   Studies: No  results found.  Scheduled Meds: . allopurinol  100 mg Oral Daily  . aspirin  81 mg Oral Daily  . carvedilol  6.25 mg Oral BID WC  . ceFEPime (MAXIPIME) IV  2 g Intravenous Q12H  . colchicine  0.6 mg Oral Daily  . collagenase   Topical Daily  . dextrose      . docusate sodium  100 mg Oral Daily  . HYDROcodone-acetaminophen  1 tablet Oral Q6H  . insulin aspart  0-9 Units Subcutaneous TID WC  . insulin detemir  20 Units Subcutaneous BID  . isosorbide mononitrate  30 mg Oral Daily  . loratadine  10 mg Oral Daily  . multivitamin with minerals  1 tablet Oral Daily  . omega-3 acid ethyl esters  1 g Oral BID  . vancomycin  1,500 mg Intravenous Q24H  . cyanocobalamin  500 mcg Oral Daily  . [START ON 10/28/2015] Vitamin D (Ergocalciferol)  50,000 Units Oral Q7 days    Continuous Infusions: . sodium chloride 100 mL/hr at 10/22/15 2303     Time spent: 15 minutes   Hollice Espy  [facility] Pager [phone] . If 7PM-7AM, please contact night-coverage at [email or web address], password [facility] 10/23/2015, 10:01 AM  LOS: 0 days

## 2015-10-23 NOTE — Progress Notes (Signed)
Physical Therapy Wound Treatment Patient Details  Name: Lisa Burch MRN: [id number] Date of Birth: 17-Sep-1945  Today's Date: 10/23/2015 Time: 0923-1009 Time Calculation (min): 46 min  Subjective  Subjective: Pt aphasic Patient and Family Stated Goals: Pt's son present Date of Onset:  (Prior to hospitalization)  Pain Score: Pain Score: No signs of pain  Wound Assessment  Pressure Ulcer 10/21/15 Stage IV - Full thickness tissue loss with exposed bone, tendon or muscle. Pt has 3 inch full thickness pressure ulcer to sacral area (Active)  Dressing Type ABD;Barrier Film (skin prep);Gauze (Comment);Moist to dry 10/23/2015 11:46 AM  Dressing Changed;Clean;Dry;Intact 10/23/2015 11:46 AM  Dressing Change Frequency Daily 10/23/2015 11:46 AM  State of Healing Eschar 10/23/2015 11:46 AM  Site / Wound Assessment Black;Brown;Red;Pink 10/23/2015 11:46 AM  % Wound base Red or Granulating 40% 10/23/2015 11:46 AM  % Wound base Yellow 5% 10/23/2015 11:46 AM  % Wound base Black 55% 10/23/2015 11:46 AM  % Wound base Other (Comment) 0% 10/23/2015 11:46 AM  Peri-wound Assessment Intact 10/23/2015 11:46 AM  Wound Length (cm) 9 cm 10/22/2015 11:57 AM  Wound Width (cm) 7 cm 10/22/2015 11:57 AM  Wound Depth (cm) 3 cm 10/22/2015 11:57 AM  Tunneling (cm) 2 10/22/2015  4:00 AM  Margins Unattached edges (unapproximated) 10/23/2015 11:46 AM  Drainage Amount Minimal 10/23/2015 11:46 AM  Drainage Description Purulent;Odor 10/23/2015 11:46 AM  Treatment Debridement (Selective);Hydrotherapy (Pulse lavage);Packing (Saline gauze) 10/23/2015 11:46 AM   Santyl applied to wound bed prior to applying dressing.    Hydrotherapy Pulsed lavage therapy - wound location: sacrum Pulsed Lavage with Suction (psi): 8 psi Pulsed Lavage with Suction - Normal Saline Used: 1000 mL Pulsed Lavage Tip: Tip with splash shield Selective Debridement Selective Debridement - Location: sacrum Selective Debridement - Tools Used: Forceps;Scalpel Selective  Debridement - Tissue Removed: brown, black necrotic tissue   Wound Assessment and Plan  Wound Therapy - Assess/Plan/Recommendations Wound Therapy - Clinical Statement: Progressing with removal of necrotic tissue Wound Therapy - Functional Problem List: Decr sitting Factors Delaying/Impairing Wound Healing: Diabetes Mellitus;Incontinence;Immobility;Multiple medical problems;Polypharmacy Hydrotherapy Plan: Debridement;Dressing change;Patient/family education;Pulsatile lavage with suction Wound Therapy - Frequency: 6X / week Wound Therapy - Follow Up Recommendations: Other (comment) (Dependent on family plans for dc destination.) Wound Plan: See above  Wound Therapy Goals- Improve the function of patient's integumentary system by progressing the wound(s) through the phases of wound healing (inflammation - proliferation - remodeling) by: Decrease Necrotic Tissue to: 30 Decrease Necrotic Tissue - Progress: Progressing toward goal Increase Granulation Tissue to: 70 Increase Granulation Tissue - Progress: Progressing toward goal  Goals will be updated until maximal potential achieved or discharge criteria met.  Discharge criteria: when goals achieved, discharge from hospital, MD decision/surgical intervention, no progress towards goals, refusal/missing three consecutive treatments without notification or medical reason.  GP     [name] 10/23/2015, 12:28 PM [facility] PT [phone]

## 2015-10-23 NOTE — Progress Notes (Signed)
Patient is having increasing difficulty with eating/drinking. No aspiration noted when she does swallow. Getting her to swallow is becoming a problem, she pockets all food/liquid in her cheek and doesn't swallow. Family is getting increasingly concerned with how to handle this and still try to feed her. Chancy MilroyS. Krishnan, MD notified.

## 2015-10-23 NOTE — Clinical Documentation Improvement (Signed)
Internal Medicine  Pressure ulcer of left heel noted in H&P. Please identify stage if known and document findings in next progress note.    Other  Clinically Undetermined  Supporting Information:  Please exercise your independent, professional judgment when responding. A specific answer is not anticipated or expected.  Thank You, Shellee MiloEileen T Retta Pitcher RN, BSN, CCDS Health Information Management [place] [phone]

## 2015-10-23 NOTE — Clinical Social Work Placement (Signed)
   CLINICAL SOCIAL WORK PLACEMENT  NOTE  Date:  10/23/2015  Patient Details  Name: Abby PotashDiana Lovingood MRN: [id number] Date of Birth: 04/30/1946  Clinical Social Work is seeking post-discharge placement for this patient at the Skilled  Nursing Facility level of care (*CSW will initial, date and re-position this form in  chart as items are completed):  Yes   Patient/family provided with [facility] Clinical Social Work Department's list of facilities offering this level of care within the geographic area requested by the patient (or if unable, by the patient's family).  Yes   Patient/family informed of their freedom to choose among providers that offer the needed level of care, that participate in Medicare, Medicaid or managed care program needed by the patient, have an available bed and are willing to accept the patient.  Yes   Patient/family informed of [facility]'s ownership interest [facility] and [facility], as well as of the fact that they are under no obligation to receive care at these facilities.  PASRR submitted to EDS on       PASRR number received on       Existing PASRR number confirmed on 10/23/15     FL2 transmitted to all facilities in geographic area requested by pt/family on 10/23/15     FL2 transmitted to all facilities within larger geographic area on       Patient informed that his/her managed care company has contracts with or will negotiate with certain facilities, including the following:            Patient/family informed of bed offers received.  Patient chooses bed at       Physician recommends and patient chooses bed at      Patient to be transferred to   on  .  Patient to be transferred to facility by       Patient family notified on   of transfer.  Name of family member notified:        PHYSICIAN Please prepare priority discharge summary, including medications, Please prepare prescriptions, Please sign FL2, Please sign DNR     Additional  Comment:    _______________________________________________ Venita Lickampbell, Talajah Slimp B, LCSW 10/23/2015, 12:55 PM

## 2015-10-23 NOTE — Progress Notes (Addendum)
Initial Nutrition Assessment  DOCUMENTATION CODES:   Morbid obesity  INTERVENTION:   -Glucerna Shake po BID, each supplement provides 220 kcal and 10 grams of protein -30 ml Prostat TID, each supplement provides 100 kcals and 15 grams protein -Continue MVI daily  NUTRITION DIAGNOSIS:   Increased nutrient needs related to wound healing as evidenced by estimated needs.  GOAL:   Patient will meet greater than or equal to 90% of their needs  MONITOR:   PO intake, Supplement acceptance, Labs, Weight trends, Skin, I & O's  REASON FOR ASSESSMENT:   Consult Wound healing  ASSESSMENT:   Lisa Burch is a 70 y.o. female with history of frontal lobe dementia, diabetes mellitus, CAD was brought to the ER as patient has have rapidly progressing sacral ligament ulcer. Patient's daughter states that sacral decubitus was noticed last month which has progressed fast. Patient also has a small decubitus ulcer on the left heel and right calf. Patient's primary care physician referred her to the ER for further management. On exam that is stage IV decubitus on the sacral area. Patient is not febrile. Patient is demented and does not provide any history. As per family patient did not have any nausea vomiting diarrhea or chest pain.   Pt admitted with sepsis related to stage IV sacral decubitus pressure ulcer.   Hx obtained from family members (pt son and two cousins) at bedside. They reports that pt generally has a good appetite PTA. Over the past month, they share that pt has had to be fed and noticed some pocketing of foods in her mouth. Pt underwent BSE on 10/22/15 and pt was placed on a dysphagia 1 diet with thin liquids. Pt son reports she was able to eat some breakfast this morning (PO: 10-50% per meal completion records).   No wt hx available to assess at this time. Pt family members unsure of UBW, however, suspect pt has lost weight.   Reviewed CWOCN note from 10/22/15; pt with unstageable  pressure injury to rt sacrum/ischium, unstageable pressure injury to rt posterior calf, and DTPI to lt medial heel. Pt on air mattress to help support wound healing.   Per pt son, pt was taking vitamins at home to support wound healing, but unsure of which types. Family amenable to supplements, however, requesting low carbohydrate supplements due to hx of DM. RD will add Glucerna and Prostat. Discussed importance of good protein intake to help facilitate wound healing.   Spoke with RN, who confirmed pt with hypoglycemic event this morning. She confirmed pt was able to consume juice well.   Nutrition-Focused physical exam completed. Findings are mild fat depletion, mild muscle depletion, and no edema. Suspect fat and muscle depletion is related to advanced age and bed bound status.   Labs reviewed: CBGS: 17-112.   Diet Order:  DIET - DYS 1 Room service appropriate?: Yes; Fluid consistency:: Thin  Skin:  Wound (see comment) (UN rt ischium and scrum, NU rt posterior calf, DTPI lt heel)  Last BM:  10/20/15  Height:   Ht Readings from Last 1 Encounters:  10/21/15 5\' 11"  (1.803 m)    Weight:   Wt Readings from Last 1 Encounters:  10/22/15 204 lb 4.8 oz (92.67 kg)    Ideal Body Weight:  75.5 kg  BMI:  Body mass index is 28.51 kg/(m^2).  Estimated Nutritional Needs:   Kcal:  2000-2200  Protein:  130-140 grams  Fluid:  2.0-2.2 L  EDUCATION NEEDS:   Education needs addressed  Lisa Burch, RD, LDN, CDE Pager: [phone] After hours Pager: [phone]

## 2015-10-23 NOTE — Clinical Social Work Note (Signed)
SNF bed offers provided to daughter and list left at bedside.  Lisa Burch [name] MSW, [place]CS[place], [place], [phone]

## 2015-10-23 NOTE — Progress Notes (Signed)
CRITICAL VALUE ALERT  Critical value received:  CBG 17 (D50, Stat CBC, Juice, Responsive)   Date of notification:  10/23/15  Time of notification:  0815  Critical value read back:Yes.    Nurse who received alert:  CJ RN  MD notified (1st page):  Chancy MilroyS. Krishnan  Time of first page:  0815  MD notified (2nd page): Chancy MilroyS. Krishnan (Follow-up CBG 112)  Time of second page: 0830   Responding MD:    Time MD responded:

## 2015-10-24 ENCOUNTER — Inpatient Hospital Stay (HOSPITAL_COMMUNITY): Payer: Medicare Other

## 2015-10-24 DIAGNOSIS — A419 Sepsis, unspecified organism: Principal | ICD-10-CM

## 2015-10-24 DIAGNOSIS — N390 Urinary tract infection, site not specified: Secondary | ICD-10-CM

## 2015-10-24 LAB — URINE CULTURE: Culture: >=100000

## 2015-10-24 LAB — BASIC METABOLIC PANEL
Anion gap: 9 (ref 5–15)
BUN: 9 mg/dL (ref 6–20)
CO2: 23 mmol/L (ref 22–32)
Calcium: 8.3 mg/dL — ABNORMAL LOW (ref 8.9–10.3)
Chloride: 108 mmol/L (ref 101–111)
Creatinine, Ser: 0.5 mg/dL (ref 0.44–1.00)
GFR calc Af Amer: >60 mL/min (ref >60)
GLUCOSE: 87 mg/dL (ref 65–99)
Potassium: 3.4 mmol/L — ABNORMAL LOW (ref 3.5–5.1)
Sodium: 140 mmol/L (ref 135–145)

## 2015-10-24 LAB — CBC
HCT: 33.6 % — ABNORMAL LOW (ref 36.0–46.0)
Hemoglobin: 10.2 g/dL — ABNORMAL LOW (ref 12.0–15.0)
MCH: 24.5 pg — AB (ref 26.0–34.0)
MCHC: 30.4 g/dL (ref 30.0–36.0)
MCV: 80.8 fL (ref 78.0–100.0)
PLATELETS: 332 10*3/uL (ref 150–400)
RBC: 4.16 MIL/uL (ref 3.87–5.11)
RDW: 19 % — AB (ref 11.5–15.5)
WBC: 10.4 10*3/uL (ref 4.0–10.5)

## 2015-10-24 LAB — GLUCOSE, CAPILLARY
GLUCOSE-CAPILLARY: 84 mg/dL (ref 65–99)
GLUCOSE-CAPILLARY: 153 mg/dL — AB (ref 65–99)
Glucose-Capillary: 24 mg/dL — CL (ref 65–99)
Glucose-Capillary: 72 mg/dL (ref 65–99)
Glucose-Capillary: 81 mg/dL (ref 65–99)
Glucose-Capillary: 169 mg/dL — ABNORMAL HIGH (ref 65–99)

## 2015-10-24 LAB — HEMOGLOBIN A1C
Hgb A1c MFr Bld: 5.8 % — ABNORMAL HIGH (ref 4.8–5.6)
Mean Plasma Glucose: 120 mg/dL

## 2015-10-24 LAB — MAGNESIUM: Magnesium: 1.5 mg/dL — ABNORMAL LOW (ref 1.7–2.4)

## 2015-10-24 MED ORDER — INSULIN DETEMIR 100 UNIT/ML ~~LOC~~ SOLN
15.0000 [IU] | Freq: Two times a day (BID) | SUBCUTANEOUS | Status: DC
  Administered 2015-10-25 – 2015-10-27 (×4): 15 [IU] via SUBCUTANEOUS
  Filled 2015-10-24 (×10): qty 0.15

## 2015-10-24 MED ORDER — FLEET ENEMA 7-19 GM/118ML RE ENEM
1.0000 | ENEMA | Freq: Once | RECTAL | Status: DC
  Filled 2015-10-24: qty 1

## 2015-10-24 MED ORDER — MEGESTROL ACETATE 400 MG/10ML PO SUSP
400.0000 mg | Freq: Every day | ORAL | Status: DC
  Administered 2015-10-24 – 2015-10-25 (×2): 400 mg via ORAL
  Filled 2015-10-24 (×8): qty 10

## 2015-10-24 MED ORDER — ONDANSETRON HCL 4 MG/2ML IJ SOLN
4.0000 mg | Freq: Once | INTRAMUSCULAR | Status: AC
  Administered 2015-10-24: 4 mg via INTRAVENOUS

## 2015-10-24 MED ORDER — DOCUSATE SODIUM 100 MG PO CAPS
100.0000 mg | ORAL_CAPSULE | Freq: Two times a day (BID) | ORAL | Status: DC
  Administered 2015-10-26 – 2015-10-27 (×3): 100 mg via ORAL
  Filled 2015-10-24 (×5): qty 1

## 2015-10-24 NOTE — Progress Notes (Addendum)
PROGRESS NOTE  Lisa Burch ZOX:[id number] DOB: 06-10-1946 DOA: 10/21/2015 PCP: No primary care provider on file.  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  On admission, patient found to be in sepsis with elevated white blood cell count and elevated lactic acid level is 2.43. Patient started on IV antibiotics.    Sepsis stabilized by hospital day 2. Patient seen by wound care and started on hydrotherapy. Had hypoglycemic event 3/16 which quickly rebounded.  White count normalized on 3/16. However, patient has eaten minimally & is pocketing food.  Patient herself remains nonverbal, unable to communicate  Assessment/Plan: Principal Problem:   Sepsis secondary to Sacral decubitus ulcer  Present on admission, stage IV./part unstageable: Continue IV antibiotics. Patient meets criteria for sepsis given lactic acidosis, leukocytosis, tachycardia and decubitus ulcer source. Sepsis looks to have stabilized. Continue IV fluids.  White blood cell count still elevated although somewhat improved.  Active Problems:   Frontal lobe dementia/history of CVA: Patient's family is actually been quite involved in her care and the plan is for them to go home with her versus skilled nursing.   Diabetes mellitus type 2, controlled (HCC) with episode of hypoglycemia: On large amount of Levemir twice a day. Have decreased dose from 40-20 twice a day.   CAD (coronary artery disease)  Poor po intake: Prior to this hospitalization, family reports patient had good by mouth intake with assistance. This could be progression of her dementia exacerbated by infection/hospitalization versus lack of appetite due to infection/antibiotics versus constipation (although family reports had large bowel movement on 3/15  after several days).  Checking calorie count, have added appetite stimulant. Patient so far is only received 2 days of IV antibiotics, so I'm hesitant to change to by mouth antibiotics.  Checking abdominal x-ray to rule out constipation.  I told the family that we should be also determining the next 1-2 days if she this is going to change and she'll start eating. Have told him that either tomorrow or Saturday, if she does not, we'll discuss alternative options including comfort care. Addendum: AXR notes moderate amount of stool.  Fleet enema added.  UTI: Cultures grew out Escherichia coli, resistant to first generation cephalosporins, sensitive to Rocephin/third-generation. On fourth generation cefepime currently   Code Status:  DO NOT RESUSCITATE   Family Communication:  Multiple family members at the bedside   Disposition Plan:  Home with family versus short-term skilled nursing versus comfort care   Consultants:   Wound care   Procedures:   hydrotherapy by physical therapy   Antibiotics:   IV vancomycin and cefepime 3/14-present    Objective: BP 154/74 mmHg  Pulse 89  Temp(Src) 99.1 F (37.3 C) (Oral)  Resp 16  Ht  (1.803 m)  Wt 92.67 kg (204 lb 4.8 oz)  BMI 28.51 kg/m2  SpO2 93% No intake or output data in the 24 hours ending 10/24/15 1335 Filed Weights   10/21/15 1825 10/22/15 0428  Weight: 139.708 kg (308 lb) 92.67 kg (204 lb 4.8 oz)    Exam:   General:   Unchanged, nonverbal, yes open and tracks  Cardiovascular:  Regular rate and rhythm, S1-S2   Respiratory:  Clear to auscultation bilaterally  Abdomen:  Soft, nontender, nondistended, positive bowel sounds   Musculoskeletal:  No clubbing or cyanosis, trace edema. Smaller decub ulcers noted on lower extremities    Data Reviewed: Basic Metabolic Panel:  Recent Labs Lab 10/21/15 1905 10/22/15 0437 10/23/15 1030 10/24/15 0435  NA 137 136 141 140  K 3.7 2.8* 2.9* 3.4*  CL 98* 101 107 108  CO2  GLUCOSE 197* 146* 114* 87  BUN CREATININE 1.20* 1.09* 0.54 0.50  CALCIUM 9.3 8.4* 8.5* 8.3*  MG  --   --   --  1.5*   Liver Function Tests:  Recent Labs Lab 10/21/15 1905  AST 31  ALT 22  ALKPHOS 98  BILITOT 0.8  PROT 6.9  ALBUMIN 2.7*   No results for input(s): LIPASE, AMYLASE in the last 168 hours. No results for input(s): AMMONIA in the last 168 hours. CBC:  Recent Labs Lab 10/21/15 1905 10/22/15 0437 10/23/15 0820 10/24/15 0435  WBC 15.6* 13.9* 14.4* 10.4  NEUTROABS 11.2*  --   --   --   HGB 12.9 11.6* 12.7 10.2*  HCT 41.2 37.9 41.1 33.6*  MCV 79.4 79.0 80.6 80.8  PLT 466* 406* 368 332   Cardiac Enzymes:   No results for input(s): CKTOTAL, CKMB, CKMBINDEX, TROPONINI in the last 168 hours. BNP (last 3 results) No results for input(s): BNP in the last 8760 hours.  ProBNP (last 3 results) No results for input(s): PROBNP in the last 8760 hours.  CBG:  Recent Labs Lab 10/23/15 2203 10/23/15 2245 10/24/15 0523 10/24/15 0716 10/24/15 1118  GLUCAP 69 94 84 72 81    Recent Results (from the past 240 hour(s))  Blood culture (routine x 2)     Status: None (Preliminary result)   Collection Time: 10/21/15 11:47 PM  Result Value Ref Range Status   Specimen Description BLOOD RIGHT FOREARM  Final   Special Requests IN PEDIATRIC BOTTLE  Final   Culture NO GROWTH 2 DAYS  Final   Report Status PENDING  Incomplete  Blood culture (routine x 2)     Status: None (Preliminary result)   Collection Time: 10/21/15 11:52 PM  Result Value Ref Range Status   Specimen Description BLOOD RIGHT HAND  Final   Special Requests BOTTLES DRAWN AEROBIC AND ANAEROBIC  Final   Culture NO GROWTH 2 DAYS  Final   Report Status PENDING  Incomplete  Urine culture     Status: None   Collection Time: 10/22/15 12:57 AM  Result Value Ref Range Status   Specimen Description URINE, CATHETERIZED  Final   Special Requests NONE  Final   Culture >=100,000  COLONIES/mL ESCHERICHIA COLI  Final   Report Status 10/24/2015 FINAL  Final   Organism ID, Bacteria ESCHERICHIA COLI  Final      Susceptibility   Escherichia coli - MIC*    AMPICILLIN >=32 RESISTANT Resistant     CEFAZOLIN 32 INTERMEDIATE Intermediate     CEFTRIAXONE <=1 SENSITIVE Sensitive     CIPROFLOXACIN >=4 RESISTANT Resistant     GENTAMICIN <=1 SENSITIVE Sensitive     IMIPENEM <=0.25 SENSITIVE Sensitive     NITROFURANTOIN <=16 SENSITIVE Sensitive     TRIMETH/SULFA >=320 RESISTANT Resistant     AMPICILLIN/SULBACTAM >=32 RESISTANT Resistant     PIP/TAZO >=128 RESISTANT Resistant     * >=100,000 COLONIES/mL ESCHERICHIA COLI     Studies: No results found.  Scheduled Meds: . allopurinol  100 mg Oral Daily  . aspirin  81 mg Oral Daily  .  carvedilol  6.25 mg Oral BID WC  . ceFEPime (MAXIPIME) IV  2 g Intravenous Q12H  . colchicine  0.6 mg Oral Daily  . collagenase   Topical Daily  . docusate sodium  100 mg Oral Daily  . feeding supplement (GLUCERNA SHAKE)  237 mL Oral BID BM  . feeding supplement (PRO-STAT SUGAR FREE 64)  30 mL Oral TID BM  . HYDROcodone-acetaminophen  1 tablet Oral Q6H  . insulin aspart  0-9 Units Subcutaneous TID WC  . insulin detemir  15 Units Subcutaneous BID  . isosorbide mononitrate  30 mg Oral Daily  . loratadine  10 mg Oral Daily  . megestrol  400 mg Oral Daily  . multivitamin with minerals  1 tablet Oral Daily  . omega-3 acid ethyl esters  1 g Oral BID  . vancomycin  1,500 mg Intravenous Q24H  . cyanocobalamin  500 mcg Oral Daily  . [START ON 10/28/2015] Vitamin D (Ergocalciferol)  50,000 Units Oral Q7 days    Continuous Infusions: . 0.9 % NaCl with KCl 40 mEq / L 75 mL/hr (10/23/15 1654)     Time spent: 25 minutes   Hollice EspyKRISHNAN,SENDIL K  [facility] Pager [phone] . If 7PM-7AM, please contact night-coverage at [email or web address], password [email or web address] 10/24/2015, 1:35 PM  LOS: 1 day

## 2015-10-24 NOTE — Progress Notes (Signed)
Calorie Count Note  48 hour calorie count ordered.  Spoke with RN. She reports that intake continues to be minimal. Pt continues to pocket food. Rn reports she gave pt Ensure with Prostat mixed in; pt drank only a few sips. Pt son was feeding pt Ensure shake at time of visit.   Per MD notes, ordered Megace today to help stimulate appetite.   Diet: Dysphagia 1, thin liquids Supplements: Glucerna Shake po BID, each supplement provides 220 kcal and 10 grams of protein, 30 ml Prostat TID, MVI daily  Breakfast: 0% consumed Lunch: 38 kcals, 0 grams protein Dinner: TBD Supplements: Ensure Enlive supplement with Prostat, pt took only a few sips  Nutrition Dx: Increased nutrient needs related to wound healing as evidenced by estimated needs; ongoing  Goal: Patient will meet greater than or equal to 90% of their needs; unmet  Intervention:   -Continue Glucerna Shake po BID, each supplement provides 220 kcal and 10 grams of protein -Continue 30 ml Prostat TID, each supplement provides 100 kcals and 15 grams protein -Continue MVI daily  Onna Nodal A. Mayford KnifeWilliams, RD, LDN, CDE Pager: [phone] After hours Pager: [phone]

## 2015-10-24 NOTE — Progress Notes (Signed)
Physical Therapy Wound Treatment Patient Details  Name: Lisa Burch MRN: [id number] Date of Birth: Nov 07, 1945  Today's Date: 10/24/2015 Time: 1130-1204 Time Calculation (min): 34 min  Subjective  Subjective: Pt aphasic Patient and Family Stated Goals: Pt's son and daughter-in-law present Date of Onset:  (Prior to hospitalization)  Pain Score: Pt did not appear in distress during session. No indication of pain.   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 Pt has 3 inch full thickness pressure ulcer to sacral area (Active)  Dressing Type ABD;Gauze (Comment);Moist to dry 10/24/2015 12:19 PM  Dressing Clean;Dry;Intact 10/24/2015 12:19 PM  Dressing Change Frequency Daily 10/24/2015 12:19 PM  State of Healing Eschar 10/24/2015 12:19 PM  Site / Wound Assessment Black;Brown;Pink;Red 10/24/2015 12:19 PM  % Wound base Red or Granulating 40% 10/24/2015 12:19 PM  % Wound base Yellow 10% 10/24/2015 12:19 PM  % Wound base Black 50% 10/24/2015 12:19 PM  % Wound base Other (Comment) 0% 10/23/2015  9:30 PM  Peri-wound Assessment Intact 10/24/2015 12:19 PM  Wound Length (cm) 9 cm 10/22/2015 11:57 AM  Wound Width (cm) 7 cm 10/22/2015 11:57 AM  Wound Depth (cm) 3 cm 10/22/2015 11:57 AM  Tunneling (cm) 2 10/22/2015  4:00 AM  Margins Unattached edges (unapproximated) 10/24/2015 12:19 PM  Drainage Amount Minimal 10/24/2015 12:19 PM  Drainage Description Purulent 10/24/2015 12:19 PM  Treatment Debridement (Selective);Hydrotherapy (Pulse lavage);Packing (Saline gauze) 10/24/2015 12:19 PM  Santyl applied to wound bed prior to applying dressing.   Hydrotherapy Pulsed lavage therapy - wound location: sacrum Pulsed Lavage with Suction (psi): 8 psi Pulsed Lavage with Suction - Normal Saline Used: 1000 mL Pulsed Lavage Tip: Tip with splash shield Selective Debridement Selective Debridement - Location: sacrum Selective Debridement - Tools Used:  Forceps;Scissors Selective Debridement - Tissue Removed: brown, black necrotic tissue   Wound Assessment and Plan  Wound Therapy - Assess/Plan/Recommendations Wound Therapy - Clinical Statement: Progressing with removal of necrotic tissue Wound Therapy - Functional Problem List: Decr sitting Factors Delaying/Impairing Wound Healing: Diabetes Mellitus;Incontinence;Immobility;Multiple medical problems;Polypharmacy Hydrotherapy Plan: Debridement;Dressing change;Patient/family education;Pulsatile lavage with suction Wound Therapy - Frequency: 6X / week Wound Therapy - Follow Up Recommendations: Other (comment) (Dependent on family plans for d/c disposition) Wound Plan: See above  Wound Therapy Goals- Improve the function of patient's integumentary system by progressing the wound(s) through the phases of wound healing (inflammation - proliferation - remodeling) by: Decrease Necrotic Tissue to: 30 Decrease Necrotic Tissue - Progress: Progressing toward goal Increase Granulation Tissue to: 70 Increase Granulation Tissue - Progress: Progressing toward goal  Goals will be updated until maximal potential achieved or discharge criteria met.  Discharge criteria: when goals achieved, discharge from hospital, MD decision/surgical intervention, no progress towards goals, refusal/missing three consecutive treatments without notification or medical reason.  GP     Rolinda Roan 10/24/2015, 3:16 PM   Rolinda Roan, PT, DPT Acute Rehabilitation Services Pager: [phone]

## 2015-10-24 NOTE — Clinical Social Work Note (Addendum)
CSW talked with patient's daughter Vassie MomentStephanie Straker regarding SNF placement and facility responses. She informed CSW that CSW HarlanBryant left facility responses for her in patient's room and also emailed to her. Ms. Gabriel CirriBanner is also considering bringing patient home with hospice services and this was discussed.     Genelle BalVanessa Davetta Olliff, MSW, LCSW Licensed Clinical Social Worker Clinical Social Work Department [facility] [phone]

## 2015-10-25 DIAGNOSIS — E876 Hypokalemia: Secondary | ICD-10-CM

## 2015-10-25 DIAGNOSIS — R627 Adult failure to thrive: Secondary | ICD-10-CM

## 2015-10-25 LAB — CBC
HCT: 33.1 % — ABNORMAL LOW (ref 36.0–46.0)
HEMOGLOBIN: 9.9 g/dL — AB (ref 12.0–15.0)
MCH: 24.3 pg — AB (ref 26.0–34.0)
MCHC: 29.9 g/dL — AB (ref 30.0–36.0)
MCV: 81.1 fL (ref 78.0–100.0)
Platelets: 333 10*3/uL (ref 150–400)
RBC: 4.08 MIL/uL (ref 3.87–5.11)
RDW: 19.1 % — ABNORMAL HIGH (ref 11.5–15.5)
WBC: 9.9 10*3/uL (ref 4.0–10.5)

## 2015-10-25 LAB — GLUCOSE, CAPILLARY
GLUCOSE-CAPILLARY: 123 mg/dL — AB (ref 65–99)
GLUCOSE-CAPILLARY: 124 mg/dL — AB (ref 65–99)
GLUCOSE-CAPILLARY: 119 mg/dL — AB (ref 65–99)
Glucose-Capillary: 161 mg/dL — ABNORMAL HIGH (ref 65–99)

## 2015-10-25 LAB — MAGNESIUM: Magnesium: 1.6 mg/dL — ABNORMAL LOW (ref 1.7–2.4)

## 2015-10-25 LAB — BASIC METABOLIC PANEL
Anion gap: 6 (ref 5–15)
BUN: 11 mg/dL (ref 6–20)
CHLORIDE: 111 mmol/L (ref 101–111)
CO2: 23 mmol/L (ref 22–32)
CREATININE: 0.52 mg/dL (ref 0.44–1.00)
Calcium: 8.2 mg/dL — ABNORMAL LOW (ref 8.9–10.3)
GFR calc Af Amer: >60 mL/min (ref >60)
GFR calc non Af Amer: >60 mL/min (ref >60)
Glucose, Bld: 119 mg/dL — ABNORMAL HIGH (ref 65–99)
Potassium: 4 mmol/L (ref 3.5–5.1)
SODIUM: 140 mmol/L (ref 135–145)

## 2015-10-25 NOTE — Progress Notes (Signed)
Calorie Count Note  48 hour calorie count ordered.  Diet: Dysphagia 1, thin liquids Supplements: Glucerna Shake po BID, each supplement provides 220 kcal and 10 grams of protein, 30 ml Prostat TID, MVI daily  Day 1 Breakfast: 0% consumed Lunch: 38 kcals, 0 grams protein Dinner: 64 kcals, 0 grams protein Supplements: Ensure Enlive supplement with Prostat, pt took only a few sips  Total intake: 102 kcal (5% of minimum estimated needs)  0 protein (0% of minimum estimated needs)  Breakfast: 72 kcals, 2 grams protein Lunch: n/a Dinner: 64 kcals, 0 grams protein Supplements: minimal acceptance  Total intake: 136 kcal (7% of minimum estimated needs)  2 protein (2% of minimum estimated needs)  Average Total intake: 119 kcal (6% of minimum estimated needs)  1 protein (1% of minimum estimated needs)  Nutrition Dx: Increased nutrient needs related to wound healing as evidenced by estimated needs; ongoing  Goal: Patient will meet greater than or equal to 90% of their needs; unmet  Intervention:   Pt unable to adequately meet nutritional needs via PO route due to pocketing foods and advanced dementia.   Noted palliative care consult pending.   If pt/family continues to desire aggressive measures, recommend:  Initiate Jevity 1.2 @ 20 ml/hr via NGT and increase by 10 ml every 4 hours to goal rate of 75 ml/hr.   If not IVFS, recommend 140 ml flush 4 times daily  Tube feeding regimen provides 2160 kcal (100% of needs), 100 grams of protein, and 1452 ml of H2O (2012 ml free water with inclusion of free water flush regimen).   Lisa Burch, RD, LDN, CDE Pager: [phone] After hours Pager: [phone]

## 2015-10-25 NOTE — Progress Notes (Signed)
PROGRESS NOTE  Shajuan Musso ZOX:[id number] DOB: 1946-06-05 DOA: 10/21/2015 PCP: No primary care provider on file.  Brief narrative: Patient is a 70 year old female with past medical history of CVA and frontal lobe advanced dementia which has left her bedbound (at least 8 months), nonverbal and pocketing food (at least a month), DM 2, HTN, gout, as well as history of diabetes mellitus and brought in by family for worsening sacral decubitus ulcer. On admission, patient found to be in sepsis with elevated white blood cell count and elevated lactic acid level is 2.43. Patient started on IV antibiotics.    Sepsis stabilized by hospital day 2. Patient seen by wound care and started on hydrotherapy. Had hypoglycemic event 3/16 which quickly rebounded.  White count normalized on 3/16. However, patient continues to have poor oral intake and consistently pockets food. Discussed extensively with patient's family and requested palliative care consultation for goals of care.  Assessment/Plan: Principal Problem:   Sepsis secondary to Sacral decubitus ulcer   - Present on admission, stage IV./part unstageable.  - Continue IV antibiotics while patient has inconsistent oral intake, at least until goals of care are clarified.  - Patient met criteria for sepsis given lactic acidosis, leukocytosis, tachycardia and decubitus ulcer source.  - Sepsis physiology resolved. Leukocytosis resolved. - Patient's sacral decubitus ulcer is unlikely to heal or will take a very long time if it ever feels due to bedbound status, poor nutritional status due to reduced oral intake. She is at risk for progressive decline and recurrent sepsis related to this wound. Discussed extensively with patient's son [name] at bedside including palliative care consult for goals of care which he is agreeable to.  Active Problems:   Frontal lobe dementia/history of CVA:  - Patient's family is actually been quite involved in her care and the  plan is for them to go home with her versus skilled nursing.    Diabetes mellitus type 2, controlled ([place]) with episode of hypoglycemia:  - On large amount of Levemir twice a day. Have decreased dose from 40-20 twice a day.    CAD (coronary artery disease) - Stable and asymptomatic.  Adult failure to thrive/Poor po intake:  - Prior to this hospitalization, family reports patient pocketing food for at least the last 1 month. This could be progression of her dementia exacerbated by infection/hospitalization versus lack of appetite due to infection/antibiotics versus constipation - Patient so far is only received 2 days of IV antibiotics, so I'm hesitant to change to by mouth antibiotics.    Constipation - Seen on x-ray. Received Fleet Enema and had a BM on 3/16  UTI:  - Cultures grew out Escherichia coli, resistant to first generation cephalosporins, sensitive to Rocephin/third-generation. On fourth generation cefepime currently  Essential hypertension - Mildly uncontrolled and fluctuating  Sacral and leg wounds - Evaluated in detail by [place] RN on 3/14. Follow recommendations. Wound details as examined on 3/14 are as below: Wound type: Unstageable Pressure Injury right sacrum/R ischium: 7cm x 4cm x 3cm; 80% soft black non viable tissue/20% dark red tissue DTPI (Deep tissue pressure injury) left medial heel: 3cm x 2.5cm x 0cm; dark, purple, blood filled blister Unstageable Pressure Injury right posterior calf: 2.5cm x 3.5cm x 0cm; 100% black stable eschar  Hypokalemia - Replaced.  Hypomagnesemia Check magnesium and replace as needed.  Acute kidney injury - Resolved.   Anemia - Stable    DVT prophylaxis: SCDs Code Status:  DO NOT RESUSCITATE  Family Communication:  Discussed  with patient's son Mr. Miku Udall net bedside. Updated care and answered questions. Disposition Plan:  Home with family versus short-term skilled nursing versus comfort care   Consultants:   Wound  care   Procedures:   hydrotherapy by physical therapy   Antibiotics:   IV vancomycin 3/13 >  IV cefepime 3/13 >   Subjective  - Patient is nonverbal. As per son at bedside, foul odor from sacral decubitus site has decreased. As per RN, continues to pocket food in her mouth with reduced intake and seems to be progressively worsening.    Objective:  Filed Vitals:   10/23/15 2112 10/24/15 0529 10/24/15 1251 10/25/15 0418  BP: 144/66 138/61 154/74 176/81  Pulse: 89 68 89 88  Temp: 99.4 F (37.4 C) 98.2 F (36.8 C) 99.1 F (37.3 C) 99.6 F (37.6 C)  TempSrc: Axillary  Oral Oral  Resp: _0 Height:      Weight:      SpO2: 96% 93% 93% 94%     Intake/Output Summary (Last 24 hours) at 10/25/15 1015 Last data filed at 10/24/15 1800  Gross per 24 hour  Intake    270 ml  Output    200 ml  Net     70 ml   Filed Weights   10/21/15 1825 10/22/15 0428  Weight: 139.708 kg (308 lb) 92.67 kg (204 lb 4.8 oz)    Exam:   General:   Moderately built and poorly nourished pleasant elderly female lying comfortably propped up in bed. Does not look septic or toxic. Tracks activity around with her eyes but is nonverbal and does not follow instructions.   Cardiovascular:  Regular rate and rhythm, S1-S2   Respiratory:  Clear to auscultation bilaterally/poor effort. No increased work of breathing.   Abdomen:  Soft, nontender, nondistended, positive bowel sounds   Musculoskeletal:  No clubbing or cyanosis, trace edema. Smaller decub ulcers noted on lower extremities  Skin:  sacral wound examined with RN assistance. Still has foul odor but no significant drainage. Exam details as per picture below. Right leg ischar as per picture        Data Reviewed: Basic Metabolic Panel:  Recent Labs Lab 10/21/15 1905 10/22/15 0437 10/23/15 1030 10/24/15 0435 10/25/15 0420  NA 137 136 141 140 140  K 3.7 2.8* 2.9* 3.4* 4.0  CL 98* 101 107 108 111  CO2 _1 GLUCOSE  197* 146* 114* 87 119*  BUN _2 CREATININE 1.20* 1.09* 0.54 0.50 0.52  CALCIUM 9.3 8.4* 8.5* 8.3* 8.2*  MG  --   --   --  1.5*  --    Liver Function Tests:  Recent Labs Lab 10/21/15 1905  AST 31  ALT 22  ALKPHOS 98  BILITOT 0.8  PROT 6.9  ALBUMIN 2.7*   No results for input(s): LIPASE, AMYLASE in the last 168 hours. No results for input(s): AMMONIA in the last 168 hours. CBC:  Recent Labs Lab 10/21/15 1905 10/22/15 0437 10/23/15 0820 10/24/15 0435 10/25/15 0420  WBC 15.6* 13.9* 14.4* 10.4 9.9  NEUTROABS 11.2*  --   --   --   --   HGB 12.9 11.6* 12.7 10.2* 9.9*  HCT 41.2 37.9 41.1 33.6* 33.1*  MCV 79.4 79.0 80.6 80.8 81.1  PLT 466* 406* 368 332 333   Cardiac Enzymes:   No results for input(s): CKTOTAL, CKMB, CKMBINDEX, TROPONINI in the last 168 hours. BNP (last 3  results) No results for input(s): BNP in the last 8760 hours.  ProBNP (last 3 results) No results for input(s): PROBNP in the last 8760 hours.  CBG:  Recent Labs Lab 10/24/15 0716 10/24/15 1118 10/24/15 1656 10/24/15 2135 10/25/15 0741  GLUCAP 72 81 169* 153* 123*    Recent Results (from the past 240 hour(s))  Blood culture (routine x 2)     Status: None (Preliminary result)   Collection Time: 10/21/15 11:47 PM  Result Value Ref Range Status   Specimen Description BLOOD RIGHT FOREARM  Final   Special Requests IN PEDIATRIC BOTTLE 3ML  Final   Culture NO GROWTH 2 DAYS  Final   Report Status PENDING  Incomplete  Blood culture (routine x 2)     Status: None (Preliminary result)   Collection Time: 10/21/15 11:52 PM  Result Value Ref Range Status   Specimen Description BLOOD RIGHT HAND  Final   Special Requests BOTTLES DRAWN AEROBIC AND ANAEROBIC 5ML  Final   Culture NO GROWTH 2 DAYS  Final   Report Status PENDING  Incomplete  Urine culture     Status: None   Collection Time: 10/22/15 12:57 AM  Result Value Ref Range Status   Specimen Description URINE, CATHETERIZED  Final    Special Requests NONE  Final   Culture >=100,000 COLONIES/mL ESCHERICHIA COLI  Final   Report Status 10/24/2015 FINAL  Final   Organism ID, Bacteria ESCHERICHIA COLI  Final      Susceptibility   Escherichia coli - MIC*    AMPICILLIN >=32 RESISTANT Resistant     CEFAZOLIN 32 INTERMEDIATE Intermediate     CEFTRIAXONE <=1 SENSITIVE Sensitive     CIPROFLOXACIN >=4 RESISTANT Resistant     GENTAMICIN <=1 SENSITIVE Sensitive     IMIPENEM <=0.25 SENSITIVE Sensitive     NITROFURANTOIN <=16 SENSITIVE Sensitive     TRIMETH/SULFA >=320 RESISTANT Resistant     AMPICILLIN/SULBACTAM >=32 RESISTANT Resistant     PIP/TAZO >=128 RESISTANT Resistant     * >=100,000 COLONIES/mL ESCHERICHIA COLI     Studies: Dg Abd Portable 1v  10/24/2015  CLINICAL DATA:  Unable to communicate.  Obstipation.  Diabetes. EXAM: PORTABLE ABDOMEN - 1 VIEW COMPARISON:  10/22/2015 FINDINGS: Prominent stool throughout the colon favors constipation. 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 Lumbar spondylosis. Clips in the right upper quadrant likely from prior cholecystectomy. Vascular calcifications noted. IMPRESSION: 1.  Prominent stool throughout the colon favors constipation. 2. Lumbar spondylosis and scoliosis. Electronically Signed   By: Van Clines M.D.   On: 10/24/2015 14:10    Scheduled Meds: . allopurinol  100 mg Oral Daily  . aspirin  81 mg Oral Daily  . carvedilol  6.25 mg Oral BID WC  . ceFEPime (MAXIPIME) IV  2 g Intravenous Q12H  . colchicine  0.6 mg Oral Daily  . collagenase   Topical Daily  . docusate sodium  100 mg Oral BID  . feeding supplement (GLUCERNA SHAKE)  237 mL Oral BID BM  . feeding supplement (PRO-STAT SUGAR FREE 64)  30 mL Oral TID BM  . HYDROcodone-acetaminophen  1 tablet Oral Q6H  . insulin aspart  0-9 Units Subcutaneous TID WC  . insulin detemir  15 Units Subcutaneous BID  . isosorbide mononitrate  30 mg Oral Daily  . loratadine  10 mg Oral Daily  . megestrol  400 mg  Oral Daily  . multivitamin with minerals  1 tablet Oral Daily  . omega-3 acid ethyl esters  1  g Oral BID  . sodium phosphate  1 enema Rectal Once  . vancomycin  1,500 mg Intravenous Q24H  . cyanocobalamin  500 mcg Oral Daily  . [START ON 10/28/2015] Vitamin D (Ergocalciferol)  50,000 Units Oral Q7 days    Continuous Infusions: . 0.9 % NaCl with KCl 40 mEq / L 75 mL/hr (10/25/15 0259)     Time spent: 25 minutes     Trini Soldo, MD, FACP, FHM. [facility] Pager [phone]  If 7PM-7AM, please contact night-coverage [email or web address] Password [email or web address] 10/25/2015, 10:34 AM    LOS: 2 days

## 2015-10-25 NOTE — Progress Notes (Signed)
Physical Therapy Wound Treatment Patient Details  Name: Lisa Burch MRN: [id number] Date of Birth: Aug 10, 1946  Today's Date: 10/25/2015 Time: 1121-1150 Time Calculation (min): 29 min  Subjective  Subjective: Pt aphasic Patient and Family Stated Goals: Pt's son present during session Date of Onset:  (Prior to hospitalization)  Pain Score: Pt did not appear to be in pain or in distress during session.   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 Pt has 3 inch full thickness pressure ulcer to sacral area (Active)  Dressing Type Gauze (Comment);Moist to dry 10/25/2015 12:25 PM  Dressing Changed 10/25/2015 12:25 PM  Dressing Change Frequency Daily 10/25/2015 12:25 PM  State of Healing Eschar 10/25/2015 12:25 PM  Site / Wound Assessment Black;Brown;Pink;Red 10/25/2015 12:25 PM  % Wound base Red or Granulating 40% 10/25/2015 12:25 PM  % Wound base Yellow 20% 10/25/2015 12:25 PM  % Wound base Black 30% 10/25/2015 12:25 PM  % Wound base Other (Comment) 0% 10/25/2015 12:25 PM  Peri-wound Assessment Intact 10/25/2015 12:25 PM  Wound Length (cm) 9 cm 10/22/2015 11:57 AM  Wound Width (cm) 7 cm 10/22/2015 11:57 AM  Wound Depth (cm) 3 cm 10/22/2015 11:57 AM  Tunneling (cm) 2 10/22/2015  4:00 AM  Margins Unattached edges (unapproximated) 10/25/2015 12:25 PM  Drainage Amount Minimal 10/25/2015 12:25 PM  Drainage Description Purulent 10/25/2015 12:25 PM  Treatment Debridement (Selective);Hydrotherapy (Pulse lavage);Packing (Saline gauze) 10/25/2015 12:25 PM  Santyl applied to wound bed prior to applying dressing.   Hydrotherapy Pulsed lavage therapy - wound location: sacrum Pulsed Lavage with Suction (psi): 8 psi Pulsed Lavage with Suction - Normal Saline Used: 1000 mL Pulsed Lavage Tip: Tip with splash shield Selective Debridement Selective Debridement - Location: sacrum Selective Debridement - Tools Used: Forceps;Scissors Selective  Debridement - Tissue Removed: brown, black, yellow necrotic tissue   Wound Assessment and Plan  Wound Therapy - Assess/Plan/Recommendations Wound Therapy - Clinical Statement: Progressing with removal of necrotic tissue Wound Therapy - Functional Problem List: Decr sitting Factors Delaying/Impairing Wound Healing: Diabetes Mellitus;Incontinence;Immobility;Multiple medical problems;Polypharmacy Hydrotherapy Plan: Debridement;Dressing change;Patient/family education;Pulsatile lavage with suction Wound Therapy - Frequency: 6X / week Wound Therapy - Follow Up Recommendations: Other (comment) (Dependent on family plans for d/c destination) Wound Plan: See above  Wound Therapy Goals- Improve the function of patient's integumentary system by progressing the wound(s) through the phases of wound healing (inflammation - proliferation - remodeling) by: Decrease Necrotic Tissue to: 30 Decrease Necrotic Tissue - Progress: Progressing toward goal Increase Granulation Tissue to: 70 Increase Granulation Tissue - Progress: Progressing toward goal Time For Goal Achievement: 7 days Wound Therapy - Potential for Goals: Good  Goals will be updated until maximal potential achieved or discharge criteria met.  Discharge criteria: when goals achieved, discharge from hospital, MD decision/surgical intervention, no progress towards goals, refusal/missing three consecutive treatments without notification or medical reason.  GP     Rolinda Roan 10/25/2015, 12:31 PM   Rolinda Roan, PT, DPT Acute Rehabilitation Services Pager: [phone]

## 2015-10-25 NOTE — Care Management Important Message (Signed)
Important Message  Patient Details  Name: Lisa PotashDiana Damaso MRN: [id number] Date of Birth: 04/26/1946   Medicare Important Message Given:  Yes    Oralia RudMegan P Brynna Dobos 10/25/2015, 3:11 PM

## 2015-10-26 DIAGNOSIS — L89159 Pressure ulcer of sacral region, unspecified stage: Secondary | ICD-10-CM

## 2015-10-26 DIAGNOSIS — Z515 Encounter for palliative care: Secondary | ICD-10-CM | POA: Insufficient documentation

## 2015-10-26 LAB — GLUCOSE, CAPILLARY
GLUCOSE-CAPILLARY: 90 mg/dL (ref 65–99)
GLUCOSE-CAPILLARY: 136 mg/dL — AB (ref 65–99)
GLUCOSE-CAPILLARY: 123 mg/dL — AB (ref 65–99)
Glucose-Capillary: 127 mg/dL — ABNORMAL HIGH (ref 65–99)

## 2015-10-26 LAB — TSH: TSH: 0.986 u[IU]/mL (ref 0.350–4.500)

## 2015-10-26 MED ORDER — MORPHINE SULFATE (CONCENTRATE) 10 MG/0.5ML PO SOLN: 5.0000 mg | ORAL | Status: DC | PRN

## 2015-10-26 MED ORDER — OXYCODONE HCL 20 MG/ML PO CONC: 2.5000 mg | ORAL | Status: DC | PRN

## 2015-10-26 MED ORDER — OXYCODONE HCL 5 MG/5ML PO SOLN: 2.5000 mg | ORAL | Status: DC | PRN

## 2015-10-26 MED ORDER — MAGNESIUM SULFATE IN D5W 10-5 MG/ML-% IV SOLN
1.0000 g | Freq: Once | INTRAVENOUS | Status: AC
  Administered 2015-10-26: 1 g via INTRAVENOUS
  Filled 2015-10-26: qty 100

## 2015-10-26 MED ORDER — LORAZEPAM 2 MG/ML IJ SOLN: 0.5000 mg | INTRAMUSCULAR | Status: DC | PRN

## 2015-10-26 MED ORDER — BISACODYL 10 MG RE SUPP
10.0000 mg | RECTAL | Status: AC
  Administered 2015-10-26: 10 mg via RECTAL
  Filled 2015-10-26: qty 1

## 2015-10-26 NOTE — Progress Notes (Signed)
Pharmacy Antibiotic Note  Abby PotashDiana Rios is a 70 y.o. female admitted on 10/21/2015 with worsening ulcer on her backside.  Pharmacy has been consulted for Vancomycin and Zosyn dosing for cellulitis. WBC down to wnl. Afebrile. CrCl ~ 55-60 mL/min. UOP not well recorded. MD wants to keep broad antibiotics with sacral decubitus ulcer. IV antibiotics due to patient with poor po intake and pocketing food.   Plan: -Continue Vancomycin 1500 mg IV every 24 hours.  Goal trough 10-15 mcg/mL. -Continue Cefepime 2 gm IV Q 12 hours  -Monitor CBC, renal fx, cultures and clinical progress -VT if plan to continue therapy -MD - Please address length of therapy - currently day # 5.   Height: 5\' 11"  (180.3 cm) Weight: 204 lb 4.8 oz (92.67 kg) IBW/kg (Calculated) : 70.8  Temp (24hrs), Avg:99.5 F (37.5 C), Min:99.4 F (37.4 C), Max:99.6 F (37.6 C)   Recent Labs Lab 10/21/15 1905 10/22/15 0004 10/22/15 0437 10/22/15 1156 10/23/15 0820 10/23/15 1030 10/24/15 0435 10/25/15 0420  WBC 15.6*  --  13.9*  --  14.4*  --  10.4 9.9  CREATININE 1.20*  --  1.09*  --   --  0.54 0.50 0.52  LATICACIDVEN  --  2.43*  --  1.5  --   --   --   --     Estimated Creatinine Clearance: 79.9 mL/min (by C-G formula based on Cr of 0.52).    Allergies  Allergen Reactions  . Lactose Intolerance (Gi) Diarrhea  . Latex Other (See Comments)    Unknown   . Morphine And Related Other (See Comments)    unknown  . Penicillins Other (See Comments)    unknown    Antimicrobials this admission: 3/14 Vancomycin>> 3/14 Cefepime >>   Dose adjustments this admission: None   Microbiology results: 3/14 UCx- EColi - sens CTX 3/14 BCx2>>   Thank you for allowing pharmacy to be a part of this patient's care.  Link SnufferJessica Madason Rauls, PharmD, BCPS Clinical Pharmacist [phone] 10/26/2015,8:55 AM

## 2015-10-26 NOTE — Progress Notes (Signed)
PROGRESS NOTE  Lisa Burch UDJ:[id number] DOB: Sep 21, 1945 DOA: 10/21/2015 PCP: No primary care provider on file.  Brief narrative: Patient is a 70 year old female with past medical history of CVA and frontal lobe advanced dementia which has left her bedbound (at least 8 months), nonverbal and pocketing food (at least a month), DM 2, HTN, gout, as well as history of diabetes mellitus and brought in by family for worsening sacral decubitus ulcer. On admission, patient found to be in sepsis with elevated white blood cell count and elevated lactic acid level is 2.43. Patient started on IV antibiotics.    Sepsis stabilized by hospital day 2. Patient seen by wound care and started on hydrotherapy. Had hypoglycemic event 3/16 which quickly rebounded.  White count normalized on 3/16. However, patient continues to have poor oral intake and consistently pockets food. Discussed extensively with patient's family and requested palliative care consultation for goals of care-pending. Family comfortable with taking patient home with hospice but wish to speak with the palliative care team.  Assessment/Plan: Principal Problem:   Sepsis secondary to Sacral decubitus ulcer   - Present on admission, stage IV./part unstageable.  - Continue IV antibiotics while patient has inconsistent oral intake, at least until goals of care are clarified.  - Patient met criteria for sepsis given lactic acidosis, leukocytosis, tachycardia and decubitus ulcer source.  - Sepsis physiology resolved. Leukocytosis resolved. - Patient's sacral decubitus ulcer is unlikely to heal or will take a very long time if it ever heals due to bedbound status, poor nutritional status due to reduced oral intake. She is at risk for progressive decline and recurrent sepsis related to this wound. Discussed extensively with patient's son [name] at bedside including palliative care consult for goals of care which he is agreeable to. - [name] states  that he has discussed with his sister (patient lives with her) and they are comfortable in taking patient home, possibly with home hospice, as early as 3/19 but would like to meet with palliative care team.  Active Problems:   Frontal lobe dementia/history of CVA:  - Patient's family is actually been quite involved in her care and the plan is for them to go home. Family declines SNF.    Diabetes mellitus type 2, controlled ([place]) with episode of hypoglycemia:  - On large amount of Levemir twice a day. Have decreased dose from 40-20 twice a day. Reasonable inpatient control on current regimen.    CAD (coronary artery disease) - Stable and asymptomatic.  Adult failure to thrive/Poor po intake:  - Prior to this hospitalization, family reports patient pocketing food for at least the last 1 month. This could be progression of her dementia exacerbated by infection/hospitalization versus lack of appetite due to infection/antibiotics versus constipation - Patient so far is only received 2 days of IV antibiotics, so I'm hesitant to change to by mouth antibiotics. No change in oral intake status.   Constipation - Seen on x-ray. Received Fleet Enema and had a BM on 3/16  UTI:  - Cultures grew out Escherichia coli, resistant to first generation cephalosporins, sensitive to Rocephin/third-generation. On fourth generation cefepime currently  Essential hypertension - Mildly uncontrolled and fluctuating  Sacral and leg wounds - Evaluated in detail by Addison RN on 3/14. Follow recommendations. Wound details as examined on 3/14 are as below: Wound type: Unstageable Pressure Injury right sacrum/R ischium: 7cm x 4cm x 3cm; 80% soft black non viable tissue/20% dark red tissue DTPI (Deep tissue pressure injury) left medial  heel: 3cm x 2.5cm x 0cm; dark, purple, blood filled blister Unstageable Pressure Injury right posterior calf: 2.5cm x 3.5cm x 0cm; 100% black stable eschar  Hypokalemia -  Replaced.  Hypomagnesemia Check magnesium and replace as needed.  Acute kidney injury - Resolved.   Anemia - Stable    DVT prophylaxis: SCDs Code Status:  DO NOT RESUSCITATE  Family Communication:  Discussed with patient's son Mr. Kennidy Lamke net bedside 3/18. Updated care and answered questions. Disposition Plan:  DC home, 3/19 possibly with hospice   Consultants:   Wound care   Palliative care team-input pending  Procedures:   hydrotherapy by physical therapy   Antibiotics:   IV vancomycin 3/13 >  IV cefepime 3/13 >   Subjective  - Patient is nonverbal. As per son at bedside, continues to pocket food in her mouth with reduced intake.  Objective:  Filed Vitals:   10/24/15 1251 10/25/15 0418 10/25/15 2208 10/26/15 0649  BP: 154/74 176/81 165/68 171/84  Pulse: 89 88 97 103  Temp: 99.1 F (37.3 C) 99.6 F (37.6 C) 99.6 F (37.6 C) 99.4 F (37.4 C)  TempSrc: Oral Oral Oral Oral  Resp: '16 19 16 16  ' Height:      Weight:      SpO2: 93% 94% 96% 95%     Intake/Output Summary (Last 24 hours) at 10/26/15 1038 Last data filed at 10/26/15 0600  Gross per 24 hour  Intake 6115.01 ml  Output      0 ml  Net 6115.01 ml   Filed Weights   10/21/15 1825 10/22/15 0428  Weight: 139.708 kg (308 lb) 92.67 kg (204 lb 4.8 oz)    Exam:   General:   Moderately built and poorly nourished pleasant elderly female lying comfortably propped up in bed. Does not look septic or toxic. Tracks activity around with her eyes but is nonverbal and does not follow instructions.   Cardiovascular:  Regular rate and rhythm, S1-S2   Respiratory:  Clear to auscultation bilaterally/poor effort. No increased work of breathing.   Abdomen:  Soft, nontender, nondistended, positive bowel sounds   Musculoskeletal:  No clubbing or cyanosis, trace edema. Smaller decub ulcers noted on lower extremities  Skin: Examined 3/17: sacral wound examined with RN assistance. Still has foul odor but  no significant drainage. Exam details as per picture below. Right leg ischar as per picture        Data Reviewed: Basic Metabolic Panel:  Recent Labs Lab 10/21/15 1905 10/22/15 0437 10/23/15 1030 10/24/15 0435 10/25/15 0420 10/25/15 1311  NA 137 136 141 140 140  --   K 3.7 2.8* 2.9* 3.4* 4.0  --   CL 98* 101 107 108 111  --   CO2 '24 25 24 23 23  ' --   GLUCOSE 197* 146* 114* 87 119*  --   BUN '11 11 7 9 11  ' --   CREATININE 1.20* 1.09* 0.54 0.50 0.52  --   CALCIUM 9.3 8.4* 8.5* 8.3* 8.2*  --   MG  --   --   --  1.5*  --  1.6*   Liver Function Tests:  Recent Labs Lab 10/21/15 1905  AST 31  ALT 22  ALKPHOS 98  BILITOT 0.8  PROT 6.9  ALBUMIN 2.7*   No results for input(s): LIPASE, AMYLASE in the last 168 hours. No results for input(s): AMMONIA in the last 168 hours. CBC:  Recent Labs Lab 10/21/15 1905 10/22/15 2130 10/23/15 0820 10/24/15 0435 10/25/15 8657  WBC 15.6* 13.9* 14.4* 10.4 9.9  NEUTROABS 11.2*  --   --   --   --   HGB 12.9 11.6* 12.7 10.2* 9.9*  HCT 41.2 37.9 41.1 33.6* 33.1*  MCV 79.4 79.0 80.6 80.8 81.1  PLT 466* 406* 368 332 333   Cardiac Enzymes:   No results for input(s): CKTOTAL, CKMB, CKMBINDEX, TROPONINI in the last 168 hours. BNP (last 3 results) No results for input(s): BNP in the last 8760 hours.  ProBNP (last 3 results) No results for input(s): PROBNP in the last 8760 hours.  CBG:  Recent Labs Lab 10/24/15 2135 10/25/15 0741 10/25/15 1200 10/25/15 1701 10/25/15 2206  GLUCAP 153* 123* 161* 124* 119*    Recent Results (from the past 240 hour(s))  Blood culture (routine x 2)     Status: None (Preliminary result)   Collection Time: 10/21/15 11:47 PM  Result Value Ref Range Status   Specimen Description BLOOD RIGHT FOREARM  Final   Special Requests IN PEDIATRIC BOTTLE 3ML  Final   Culture NO GROWTH 3 DAYS  Final   Report Status PENDING  Incomplete  Blood culture (routine x 2)     Status: None (Preliminary result)    Collection Time: 10/21/15 11:52 PM  Result Value Ref Range Status   Specimen Description BLOOD RIGHT HAND  Final   Special Requests BOTTLES DRAWN AEROBIC AND ANAEROBIC 5ML  Final   Culture NO GROWTH 3 DAYS  Final   Report Status PENDING  Incomplete  Urine culture     Status: None   Collection Time: 10/22/15 12:57 AM  Result Value Ref Range Status   Specimen Description URINE, CATHETERIZED  Final   Special Requests NONE  Final   Culture >=100,000 COLONIES/mL ESCHERICHIA COLI  Final   Report Status 10/24/2015 FINAL  Final   Organism ID, Bacteria ESCHERICHIA COLI  Final      Susceptibility   Escherichia coli - MIC*    AMPICILLIN >=32 RESISTANT Resistant     CEFAZOLIN 32 INTERMEDIATE Intermediate     CEFTRIAXONE <=1 SENSITIVE Sensitive     CIPROFLOXACIN >=4 RESISTANT Resistant     GENTAMICIN <=1 SENSITIVE Sensitive     IMIPENEM <=0.25 SENSITIVE Sensitive     NITROFURANTOIN <=16 SENSITIVE Sensitive     TRIMETH/SULFA >=320 RESISTANT Resistant     AMPICILLIN/SULBACTAM >=32 RESISTANT Resistant     PIP/TAZO >=128 RESISTANT Resistant     * >=100,000 COLONIES/mL ESCHERICHIA COLI     Studies: No results found.  Scheduled Meds: . allopurinol  100 mg Oral Daily  . aspirin  81 mg Oral Daily  . carvedilol  6.25 mg Oral BID WC  . ceFEPime (MAXIPIME) IV  2 g Intravenous Q12H  . colchicine  0.6 mg Oral Daily  . collagenase   Topical Daily  . docusate sodium  100 mg Oral BID  . feeding supplement (GLUCERNA SHAKE)  237 mL Oral BID BM  . feeding supplement (PRO-STAT SUGAR FREE 64)  30 mL Oral TID BM  . HYDROcodone-acetaminophen  1 tablet Oral Q6H  . insulin aspart  0-9 Units Subcutaneous TID WC  . insulin detemir  15 Units Subcutaneous BID  . isosorbide mononitrate  30 mg Oral Daily  . loratadine  10 mg Oral Daily  . megestrol  400 mg Oral Daily  . multivitamin with minerals  1 tablet Oral Daily  . omega-3 acid ethyl esters  1 g Oral BID  . sodium phosphate  1 enema Rectal Once  .  vancomycin  1,500 mg Intravenous Q24H  . cyanocobalamin  500 mcg Oral Daily  . [START ON 10/28/2015] Vitamin D (Ergocalciferol)  50,000 Units Oral Q7 days    Continuous Infusions: . 0.9 % NaCl with KCl 40 mEq / L 50 mL/hr (10/25/15 1804)     Time spent: 61 minutes     Aleene Swanner, MD, FACP, FHM. [facility] Pager [phone]  If 7PM-7AM, please contact night-coverage [email or web address] Password [email or web address] 10/26/2015, 10:38 AM    LOS: 3 days

## 2015-10-26 NOTE — Progress Notes (Signed)
Physical Therapy Wound Treatment Patient Details  Name: Lisa Burch MRN: [id number] Date of Birth: 1945-11-24  Today's Date: 10/26/2015 Time: 10:25-11:14   Subjective  Subjective: Pt aphasic Patient and Family Stated Goals: Pt's sister present for session Date of Onset:  (present on admission)  Pain Score: Pain Score: 5   Wound Assessment  Pressure Ulcer 10/21/15 Stage IV - Full thickness tissue loss with exposed bone, tendon or muscle. Pt has 3 inch full thickness pressure ulcer to sacral area (Active)  Dressing Type Barrier Film (skin prep);Gauze (Comment);Moist to dry;Tape dressing 10/26/2015  1:26 PM  Dressing Changed;Clean;Dry;Intact 10/26/2015  1:26 PM  Dressing Change Frequency Daily 10/26/2015  1:26 PM  State of Healing Eschar 10/26/2015  1:26 PM  Site / Wound Assessment Granulation tissue;Bleeding;Yellow;Black;Brown 10/26/2015  1:26 PM  % Wound base Red or Granulating 45% 10/26/2015  1:26 PM  % Wound base Yellow 25% 10/26/2015  1:26 PM  % Wound base Black 25% 10/26/2015  1:26 PM  % Wound base Other (Comment) 5% 10/26/2015  1:26 PM  Peri-wound Assessment Intact 10/26/2015  1:26 PM  Wound Length (cm) 9 cm 10/22/2015 11:57 AM  Wound Width (cm) 7 cm 10/22/2015 11:57 AM  Wound Depth (cm) 3 cm 10/22/2015 11:57 AM  Tunneling (cm) 2 10/22/2015  4:00 AM  Margins Unattached edges (unapproximated) 10/26/2015  1:26 PM  Drainage Amount Minimal 10/26/2015  1:26 PM  Drainage Description Purulent 10/26/2015  1:26 PM  Treatment Debridement (Selective);Hydrotherapy (Pulse lavage);Packing (Saline gauze) 10/26/2015  1:26 PM      Hydrotherapy Pulsed lavage therapy - wound location: sacrum Pulsed Lavage with Suction (psi): 8 psi (8-12) Pulsed Lavage with Suction - Normal Saline Used: 1000 mL Pulsed Lavage Tip: Tip with splash shield Selective Debridement Selective Debridement - Location: sacrum Selective Debridement - Tools Used: Forceps;Scissors Selective Debridement - Tissue Removed: brown, black,  yellow necrotic tissue   Wound Assessment and Plan  Wound Therapy - Assess/Plan/Recommendations Wound Therapy - Clinical Statement: Progressing with removal of necrotic tissue. Pt. with some facial grimacing today during hydrotherapy, was premedicated. Increased time needed due to pt with food present in mouth on arrival with increased time needed to clear mouth to be safe for lowering head of bed. Also, pt with incontinent of urine at end of session with increased time needed to clean her up.                                         Wound Therapy - Functional Problem List: Decr sitting Factors Delaying/Impairing Wound Healing: Diabetes Mellitus;Incontinence;Immobility;Multiple medical problems;Polypharmacy Hydrotherapy Plan: Debridement;Dressing change;Patient/family education;Pulsatile lavage with suction Wound Therapy - Frequency: 6X / week Wound Therapy - Follow Up Recommendations: Other (comment) (pending family plans for discharge) Wound Plan: See above  Wound Therapy Goals- Improve the function of patient's integumentary system by progressing the wound(s) through the phases of wound healing (inflammation - proliferation - remodeling) by: Decrease Necrotic Tissue to: 30 Decrease Necrotic Tissue - Progress: Progressing toward goal Increase Granulation Tissue to: 70 Increase Granulation Tissue - Progress: Progressing toward goal Time For Goal Achievement: 7 days Wound Therapy - Potential for Goals: Good  Goals will be updated until maximal potential achieved or discharge criteria met.  Discharge criteria: when goals achieved, discharge from hospital, MD decision/surgical intervention, no progress towards goals, refusal/missing three consecutive treatments without notification or medical reason.   Willow Ora 10/26/2015, 1:32 PM  Juliann Pulse  Johnsie Cancel, [place] [phone] 10/26/2015, 1:33 PM

## 2015-10-26 NOTE — Consult Note (Signed)
Consultation Note Date: 10/26/2015   Patient Name: Lisa Burch  DOB: [birth year]  MRN: [id number]  Age / Sex: 70 y.o., female  PCP: No primary care provider on file. Referring Physician: Modena Jansky, MD  Reason for Consultation: Establishing goals of care, Pain control and Psychosocial/spiritual support    Clinical Assessment/Narrative: Patient is a 70 year old female with a past medical history of a CVA, frontal lobe advanced dementia, hypertension, gout, diabetes, who was brought in for worsening sacral decubitus ulcer. Per family patient has had a sharp functional decline over this past year. She has been seen at [facility] for an advanced neurological workup who revealed that most of the symptoms they were seen with secondary to her advanced dementia. She has been bed bound now for 8 months, has an unstageable decubitus ulcer. She now is no longer eating she pockets food. I observed her daughter trying to feed her, and food just rolled out of her mouth. She did open her eyes to my voice as well as to others in the room their voices when he came in but she was nonverbal the whole time. Family reports she will only occasionally say a word or 2 now and then upon admission she met sepsis criteria, her lactic acid level was 2.43. Patient has been started on antibiotics and fluids, and her white count normalized by 316. Other aspects of her what appears to be baseline status of minimally responsive nonverbal and not eating is unchanged.  Contacts/Participants in Discussion: Primary Decision Maker: Family makes decisions as a whole, the children, but  er son seems to be the primary spokesperson   Relationship to Patient Lisa Burch, son HCPOA: no  Patient lives with her daughter Lisa Burch. Other siblings reported Lisa Burch is very adamant about taking her mother back home  SUMMARY OF RECOMMENDATIONS Presented hospice as an  option in the home as well as hospice inpatient hospital Patient is no longer eating minimally responsive, septic, and would likely meet criteria for inpatient hospice as well as in home hospice Family wants to discuss hospice as an option with her sister Lisa Burch, but siblings in the room were leaning towards that as the best course for their mother We did discuss drawing a TSH to see if that could be in any way contributing to this, but overall children were in agreement that the primary symptom we were seeing was related to dementia Family agreed to a Foley to protect her skin, she has an unstageable wound Family understands the risk of aspiration but would like to continue with comfort feeds  Code Status/Advance Care Planning: DNR    Code Status Orders        Start     Ordered   10/22/15 0414  Do not attempt resuscitation (DNR)   Continuous    Question Answer Comment  In the event of cardiac or respiratory ARREST Do not call a "code blue"   In the event of cardiac or respiratory ARREST Do not perform Intubation, CPR, defibrillation or ACLS   In the event of cardiac or respiratory ARREST Use medication by any route, position, wound care, and other measures to relive pain and suffering. May use oxygen, suction and manual treatment of airway obstruction as needed for comfort.      10/22/15 0413    Code Status History    Date Active Date Inactive Code Status Order ID Comments User Context   10/22/2015 12:36 AM 10/22/2015  4:13 AM DNR [id number]  Merryl Hacker, MD ED  Other Directives:None  Symptom Management:   Pain: Patient has been having severe pain to her sacrum, as well as a history of gout. She has been on Vicodin for year but is now reaching the point where she can no longer swallow pills. We'll discontinue the Vicodin and start oxycodone liquid 2.5 mg to 5 mg every 4 when necessary. We'll insert a Foley catheter for pain management in terms of incontinence affecting  wound care  Constipation: We'll order Dulcolax suppository now. We'll assess for results and implement a bowel regimen  Palliative Prophylaxis:   Aspiration, Bowel Regimen, Delirium Protocol, Frequent Pain Assessment, Oral Care and Turn Reposition    Psycho-social/Spiritual:  Support System: Strong Desire for further Chaplaincy support:no Additional Recommendations: Grief/Bereavement Support  Prognosis: < 4 weeks patient is no longer taking anything by mouth, is pocketing food, aspirating. She is minimally responsive and is now septic from a sacral wound  Discharge Planning: TBD   Chief Complaint/ Primary Diagnoses: Present on Admission:  . Sacral decubitus ulcer . Frontal lobe dementia . CAD (coronary artery disease) . Sepsis ([place])  I have reviewed the medical record, interviewed the patient and family, and examined the patient. The following aspects are pertinent.  Past Medical History  Diagnosis Date  . Hypertension   . Diabetes mellitus without complication ([place])   . Gout    Social History   Social History  . Marital Status: Widowed    Spouse Name: N/A  . Number of Children: N/A  . Years of Education: N/A   Social History Main Topics  . Smoking status: Never Smoker   . Smokeless tobacco: None  . Alcohol Use: No  . Drug Use: No  . Sexual Activity: Not Asked   Other Topics Concern  . None   Social History Narrative   Family History  Problem Relation Age of Onset  . Stroke Mother   . Diabetes Mellitus II Neg Hx    Scheduled Meds: . allopurinol  100 mg Oral Daily  . aspirin  81 mg Oral Daily  . carvedilol  6.25 mg Oral BID WC  . ceFEPime (MAXIPIME) IV  2 g Intravenous Q12H  . colchicine  0.6 mg Oral Daily  . collagenase   Topical Daily  . docusate sodium  100 mg Oral BID  . feeding supplement (GLUCERNA SHAKE)  237 mL Oral BID BM  . feeding supplement (PRO-STAT SUGAR FREE 64)  30 mL Oral TID BM  . insulin aspart  0-9 Units Subcutaneous TID WC  .  insulin detemir  15 Units Subcutaneous BID  . isosorbide mononitrate  30 mg Oral Daily  . loratadine  10 mg Oral Daily  . megestrol  400 mg Oral Daily  . multivitamin with minerals  1 tablet Oral Daily  . omega-3 acid ethyl esters  1 g Oral BID  . sodium phosphate  1 enema Rectal Once  . vancomycin  1,500 mg Intravenous Q24H  . cyanocobalamin  500 mcg Oral Daily  . [START ON 10/28/2015] Vitamin D (Ergocalciferol)  50,000 Units Oral Q7 days   Continuous Infusions: . 0.9 % NaCl with KCl 40 mEq / L 50 mL/hr (10/25/15 1804)   PRN Meds:.acetaminophen **OR** acetaminophen, ALPRAZolam, dextrose, ondansetron **OR** ondansetron (ZOFRAN) IV, oxyCODONE Medications Prior to Admission:  Prior to Admission medications   Medication Sig Start Date End Date Taking? Authorizing Provider  allopurinol (ZYLOPRIM) 100 MG tablet Take 100 mg by mouth daily.   Yes Historical Provider, MD  ALPRAZolam Duanne Moron) 0.5 MG  tablet Take 0.5 mg by mouth as needed for anxiety.   Yes Historical Provider, MD  aspirin 81 MG chewable tablet Chew 81 mg by mouth daily.   Yes Historical Provider, MD  carvedilol (COREG) 6.25 MG tablet Take 6.25 mg by mouth 2 (two) times daily with a meal.   Yes Historical Provider, MD  cetirizine (ZYRTEC) 10 MG tablet Take 10 mg by mouth daily.   Yes Historical Provider, MD  clotrimazole-betamethasone (LOTRISONE) cream Apply 1 application topically 2 (two) times daily.   Yes Historical Provider, MD  colchicine 0.6 MG tablet Take 0.6 mg by mouth daily.   Yes Historical Provider, MD  cyanocobalamin 500 MCG tablet Take 500 mcg by mouth daily.   Yes Historical Provider, MD  docusate sodium (COLACE) 100 MG capsule Take 100 mg by mouth daily.   Yes Historical Provider, MD  HYDROcodone-acetaminophen (NORCO) 7.5-325 MG tablet Take 1 tablet by mouth every 6 (six) hours.   Yes Historical Provider, MD  insulin detemir (LEVEMIR) 100 UNIT/ML injection Inject 40 Units into the skin 2 (two) times daily.   Yes  Historical Provider, MD  insulin regular (NOVOLIN R,HUMULIN R) 100 units/mL injection Inject 1-15 Units into the skin 3 (three) times daily before meals. Sliding scale   Yes Historical Provider, MD  isosorbide mononitrate (IMDUR) 30 MG 24 hr tablet Take 30 mg by mouth daily.   Yes Historical Provider, MD  Multiple Vitamin (MULTIVITAMIN WITH MINERALS) TABS tablet Take 1 tablet by mouth daily.   Yes Historical Provider, MD  Omega-3 Fatty Acids (FISH OIL) 1000 MG CAPS Take 1,000 mg by mouth daily.   Yes Historical Provider, MD  Vitamin D, Ergocalciferol, (DRISDOL) 50000 units CAPS capsule Take 50,000 Units by mouth every 7 (seven) days. On mondays   Yes Historical Provider, MD   Allergies  Allergen Reactions  . Lactose Intolerance (Gi) Diarrhea  . Latex Other (See Comments)    Unknown   . Morphine And Related Other (See Comments)    whelps and hives  . Penicillins Other (See Comments)    unknown    Review of Systems  Unable to perform ROS   Physical Exam  Nursing note and vitals reviewed. Constitutional: She appears well-developed.  HENT:  Head: Normocephalic.  Respiratory: Effort normal.  GI: Soft. She exhibits distension.  Neurological:  Minimally responsive  Skin: Skin is warm and dry.    Vital Signs: BP 184/90 mmHg  Pulse 96  Temp(Src) 99.7 F (37.6 C) (Oral)  Resp 17  Ht '5\' 11"'  (1.803 m)  Wt 92.67 kg (204 lb 4.8 oz)  BMI 28.51 kg/m2  SpO2 96%  SpO2: SpO2: 96 % O2 Device:SpO2: 96 % O2 Flow Rate: .   IO: Intake/output summary:  Intake/Output Summary (Last 24 hours) at 10/26/15 1740 Last data filed at 10/26/15 1627  Gross per 24 hour  Intake 2323.34 ml  Output    900 ml  Net 1423.34 ml    LBM: Last BM Date: 10/23/15 Baseline Weight: Weight: (!) 139.708 kg (308 lb) Most recent weight: Weight: 92.67 kg (204 lb 4.8 oz)      Palliative Assessment/Data:  Flowsheet Rows        Most Recent Value   Intake Tab    Referral Department  Hospitalist   Unit at Time  of Referral  Med/Surg Unit   Palliative Care Primary Diagnosis  Sepsis/Infectious Disease   Date Notified  10/25/15   Palliative Care Type  New Palliative care   Reason for referral  Clarify Goals of Care   Date of Admission  10/21/15   Date first seen by Palliative Care  10/26/15   # of days Palliative referral response time  1 Day(s)   # of days IP prior to Palliative referral  4   Clinical Assessment    Palliative Performance Scale Score  30%   Pain Max last 24 hours  Not able to report   Pain Min Last 24 hours  Not able to report   Dyspnea Max Last 24 Hours  Not able to report   Dyspnea Min Last 24 hours  Not able to report   Nausea Max Last 24 Hours  Not able to report   Nausea Min Last 24 Hours  Not able to report   Anxiety Max Last 24 Hours  Not able to report   Anxiety Min Last 24 Hours  Not able to report   Other Max Last 24 Hours  Not able to report   Psychosocial & Spiritual Assessment    Palliative Care Outcomes    Patient/Family meeting held?  Yes   Who was at the meeting?  son, 2 daughters, and granddaughter   Palliative Care Outcomes  Improved pain interventions   Palliative Care follow-up planned  Yes, Facility      Additional Data Reviewed:  CBC:    Component Value Date/Time   WBC 9.9 10/25/2015 0420   HGB 9.9* 10/25/2015 0420   HCT 33.1* 10/25/2015 0420   PLT 333 10/25/2015 0420   MCV 81.1 10/25/2015 0420   NEUTROABS 11.2* 10/21/2015 1905   LYMPHSABS 3.0 10/21/2015 1905   MONOABS 1.3* 10/21/2015 1905   EOSABS 0.1 10/21/2015 1905   BASOSABS 0.1 10/21/2015 1905   Comprehensive Metabolic Panel:    Component Value Date/Time   NA 140 10/25/2015 0420   K 4.0 10/25/2015 0420   CL 111 10/25/2015 0420   CO2 23 10/25/2015 0420   BUN 11 10/25/2015 0420   CREATININE 0.52 10/25/2015 0420   GLUCOSE 119* 10/25/2015 0420   CALCIUM 8.2* 10/25/2015 0420   AST 31 10/21/2015 1905   ALT 22 10/21/2015 1905   ALKPHOS 98 10/21/2015 1905   BILITOT 0.8 10/21/2015  1905   PROT 6.9 10/21/2015 1905   ALBUMIN 2.7* 10/21/2015 1905     Time In: 1330 Time Out: 1500 Time Total: 90 min Greater than 50%  of this time was spent counseling and coordinating care related to the above assessment and plan. Dr. Janifer Adie  Signed by: Dory Horn, NP  Dory Horn, NP  10/26/2015, 5:40 PM  Please contact Palliative Medicine Team phone at [phone] for questions and concerns.

## 2015-10-27 DIAGNOSIS — N3000 Acute cystitis without hematuria: Secondary | ICD-10-CM

## 2015-10-27 LAB — CULTURE, BLOOD (ROUTINE X 2)
CULTURE: NO GROWTH
CULTURE: NO GROWTH

## 2015-10-27 LAB — GLUCOSE, CAPILLARY
GLUCOSE-CAPILLARY: 186 mg/dL — AB (ref 65–99)
GLUCOSE-CAPILLARY: 123 mg/dL — AB (ref 65–99)
Glucose-Capillary: 84 mg/dL (ref 65–99)
Glucose-Capillary: 129 mg/dL — ABNORMAL HIGH (ref 65–99)

## 2015-10-27 MED ORDER — OXYCODONE HCL 5 MG/5ML PO SOLN: 2.5000 mg | Freq: Four times a day (QID) | ORAL | Status: AC | PRN

## 2015-10-27 MED ORDER — DOXYCYCLINE HYCLATE 100 MG PO TABS: 100.0000 mg | ORAL_TABLET | Freq: Two times a day (BID) | ORAL | Status: DC

## 2015-10-27 MED ORDER — DOCUSATE SODIUM 100 MG PO CAPS: 100.0000 mg | ORAL_CAPSULE | Freq: Two times a day (BID) | ORAL | Status: AC

## 2015-10-27 MED ORDER — INSULIN DETEMIR 100 UNIT/ML ~~LOC~~ SOLN: 10.0000 [IU] | Freq: Two times a day (BID) | SUBCUTANEOUS | Status: AC

## 2015-10-27 MED ORDER — COLLAGENASE 250 UNIT/GM EX OINT: TOPICAL_OINTMENT | Freq: Every day | CUTANEOUS | Status: AC

## 2015-10-27 MED ORDER — GLUCERNA SHAKE PO LIQD: 237.0000 mL | Freq: Two times a day (BID) | ORAL | Status: AC

## 2015-10-27 MED ORDER — PRO-STAT SUGAR FREE PO LIQD: 30.0000 mL | Freq: Three times a day (TID) | ORAL | Status: AC

## 2015-10-27 NOTE — Discharge Instructions (Addendum)
Sepsis, Adult Sepsis is a serious infection of your blood or tissues that affects your whole body. The infection that causes sepsis may be bacterial, viral, fungal, or parasitic. Sepsis may be life threatening. Sepsis can cause your blood pressure to drop. This may result in shock. Shock causes your central nervous system and your organs to stop working correctly.  RISK FACTORS Sepsis can happen in anyone, but it is more likely to happen in people who have weakened immune systems. SIGNS AND SYMPTOMS  Symptoms of sepsis can include:  Fever or low body temperature (hypothermia).  Rapid breathing (hyperventilation).  Chills.  Rapid heartbeat (tachycardia).  Confusion or light-headedness.  Trouble breathing.  Urinating much less than usual.  Cool, clammy skin or red, flushed skin.  Other problems with the heart, kidneys, or brain. DIAGNOSIS  Your health care provider will likely do tests to look for an infection, to see if the infection has spread to your blood, and to see how serious your condition is. Tests can include:  Blood tests, including cultures of your blood.  Cultures of other fluids from your body, such as:  Urine.  Pus from wounds.  Mucus coughed up from your lungs.  Urine tests other than cultures.  X-ray exams or other imaging tests. TREATMENT  Treatment will begin with elimination of the source of infection. If your sepsis is likely caused by a bacterial or fungal infection, you will be given antibiotic or antifungal medicines. You may also receive:  Oxygen.  Fluids through an IV tube.  Medicines to increase your blood pressure.  A machine to clean your blood (dialysis) if your kidneys fail.  A machine to help you breathe if your lungs fail. SEEK IMMEDIATE MEDICAL CARE IF: You get an infection or develop any of the signs and symptoms of sepsis after surgery or a hospitalization.   This information is not intended to replace advice given to you by  your health care provider. Make sure you discuss any questions you have with your health care provider.   Document Released: 04/25/2003 Document Revised: 12/11/2014 Document Reviewed: 04/03/2013 Elsevier Interactive Patient Education 2016 [employer]  Urinary Tract Infection Urinary tract infections (UTIs) can develop anywhere along your urinary tract. Your urinary tract is your body's drainage system for removing wastes and extra water. Your urinary tract includes two kidneys, two ureters, a bladder, and a urethra. Your kidneys are a pair of bean-shaped organs. Each kidney is about the size of your fist. They are located below your ribs, one on each side of your spine. CAUSES Infections are caused by microbes, which are microscopic organisms, including fungi, viruses, and bacteria. These organisms are so small that they can only be seen through a microscope. Bacteria are the microbes that most commonly cause UTIs. SYMPTOMS  Symptoms of UTIs may vary by age and gender of the patient and by the location of the infection. Symptoms in young women typically include a frequent and intense urge to urinate and a painful, burning feeling in the bladder or urethra during urination. Older women and men are more likely to be tired, shaky, and weak and have muscle aches and abdominal pain. A fever may mean the infection is in your kidneys. Other symptoms of a kidney infection include pain in your back or sides below the ribs, nausea, and vomiting. DIAGNOSIS To diagnose a UTI, your caregiver will ask you about your symptoms. Your caregiver will also ask you to provide a urine sample. The urine sample will be  tested for bacteria and white blood cells. White blood cells are made by your body to help fight infection. TREATMENT  Typically, UTIs can be treated with medication. Because most UTIs are caused by a bacterial infection, they usually can be treated with the use of antibiotics. The choice of antibiotic and  length of treatment depend on your symptoms and the type of bacteria causing your infection. HOME CARE INSTRUCTIONS  If you were prescribed antibiotics, take them exactly as your caregiver instructs you. Finish the medication even if you feel better after you have only taken some of the medication.  Drink enough water and fluids to keep your urine clear or pale yellow.  Avoid caffeine, tea, and carbonated beverages. They tend to irritate your bladder.  Empty your bladder often. Avoid holding urine for long periods of time.  Empty your bladder before and after sexual intercourse.  After a bowel movement, women should cleanse from front to back. Use each tissue only once. SEEK MEDICAL CARE IF:   You have back pain.  You develop a fever.  Your symptoms do not begin to resolve within 3 days. SEEK IMMEDIATE MEDICAL CARE IF:   You have severe back pain or lower abdominal pain.  You develop chills.  You have nausea or vomiting.  You have continued burning or discomfort with urination. MAKE SURE YOU:   Understand these instructions.  Will watch your condition.  Will get help right away if you are not doing well or get worse.   This information is not intended to replace advice given to you by your health care provider. Make sure you discuss any questions you have with your health care provider.   Document Released: 05/06/2005 Document Revised: 04/17/2015 Document Reviewed: 09/04/2011 Elsevier Interactive Patient Education 2016 [employer] Inc[employer]   Pressure Injury A pressure injury, sometimes called a bedsore, is an injury to the skin and underlying tissue caused by pressure. Pressure on blood vessels causes decreased blood flow to the skin, which can eventually cause the skin tissue to die and break down into a wound. Pressure injuries usually occur:  Over bony parts of the body such as the tailbone, shoulders, elbows, hips, and heels.  Under medical devices such as respiratory  equipment, stockings, tubes, and splints. Pressure injuries start as reddened areas on the skin and can lead to pain, muscle damage, and infection. Pressure injuries can vary in severity.  CAUSES Pressure injuries are caused by a lack of blood supply to an area of skin. They can occur from intense pressure over a short period of time or from less intense pressure over a long period of time. RISK FACTORS This condition is more likely to develop in people who:  Are in the hospital or an extended care facility.  Are bedridden or in a wheelchair.  Have an injury or disease that keeps them from:  Moving normally.  Feeling pain or pressure.  Have a condition that:  Makes them sleepy or less alert.  Causes poor blood flow.  Need to wear a medical device.  Have poor control of their bladder or bowel functions (incontinence).  Have poor nutrition (malnutrition).  Are of certain ethnicities. People of African American and Latino or Hispanic descent are at higher risk compared to other ethnic groups. If you are at risk for pressure ulcers, your health care provider may recommend certain types of bedding to help prevent them. These may include foam or gel mattresses covered with one of the following:  A sheepskin  blanket.  A pad that is filled with gel, air, water, or foam. SYMPTOMS  The main symptom is a blister or change in skin color that opens into a wound. Other symptoms include:   Red or dark areas of skin that do not turn white or pale when pressed with a finger.   Pain, warmth, or change of skin texture.  DIAGNOSIS This condition is diagnosed with a medical history and physical exam. You may also have tests, including:   Blood tests to check for infection or signs of poor nutrition.  Imaging studies to check for damage to the deep tissues under your skin.  Blood flow studies. Your pressure injury will be staged to determine its severity. Staging is an assessment  of:  The depth of the pressure injury.  Which tissues are exposed because of the pressure injury.  The causes of the pressure injury. TREATMENT The main focus of treatment is to help your injury heal. This may be done by:   Relieving or redistributing pressure on your skin. This includes:  Frequently changing your position.  Eliminating or minimizing positions that caused the wound or that can make the wound worse.  Using specific bed mattresses and chair cushions.  Refitting, resizing, or replacing any medical devices, or padding the skin under them.  Using creams or powders to prevent rubbing (friction) on the skin.  Keeping your skin clean and dry. This may include using a skin cleanser or skin protectant as told by your health care provider. This may be a lotion, ointment, or spray.  Cleaning your injury and removing any dead tissue from the wound (debridement).  Placing a bandage (dressing) over your injury.  Preventing or treating infection. This may include antibiotic, antimicrobial, or antiseptic medicines. Treatment may also include medicine for pain. Sometimes surgery is needed to close the wound with a flap of healthy skin or a piece of skin from another area of your body (graft). You may need surgery if other treatments are not working or if your injury is very deep. HOME CARE INSTRUCTIONS Wound Care  Follow instructions from your health care provider about:  How to take care of your wound.  When and how you should change your dressing.  When you should remove your dressing. If your dressing is dry and stuck when you try to remove it, moisten or wet the dressing with saline or water so that it can be removed without harming your skin or wound tissue.  Check your wound every day for signs of infection. Have a caregiver do this for you if you are not able. Watch for:  More redness, swelling, or pain.  More fluid, blood, or pus.  A bad smell. Skin  Care  Keep your skin clean and dry. Gently pat your skin dry.  Do not rub or massage your skin.  Use a skin protectant only as told by your health care provider.  Check your skin every day for any changes in color or any new blisters or sores (ulcers). Have a caregiver do this for you if you are not able. Medicines  Take over-the-counter and prescription medicines only as told by your health care provider.  If you were prescribed an antibiotic medicine, take it or apply it as told by your health care provider. Do not stop taking or using the antibiotic even if your condition improves. Reducing and Redistributing Pressure  Do not lie or sit in one position for a long time. Move or change position  every two hours or as told by your health care provider.  Use pillows or cushions to reduce pressure. Ask your health care provider to recommend cushions or pads for you.  Use medical devices that do not rub your skin. Tell your health care provider if one of your medical devices is causing a pressure injury to develop. General Instructions  Eat a healthy diet that includes lots of protein. Ask your health care provider for diet advice.  Drink enough fluid to keep your urine clear or pale yellow.  Be as active as you can every day. Ask your health care provider to suggest safe exercises or activities.  Do not abuse drugs or alcohol.  Keep all follow-up visits as told by your health care provider. This is important.  Do not smoke. SEEK MEDICAL CARE IF:  You have chills or fever.  Your pain medicine is not helping.  You have any changes in skin color.  You have new blisters or sores.  You develop warmth, redness, or swelling near a pressure injury.  You have a bad odor or pus coming from your pressure injury.  You lose control of your bowels or bladder.  You develop new symptoms.  Your wound does not improve after 1-2 weeks of treatment.  You develop a new medical  condition, such as diabetes, peripheral vascular disease, or conditions that affect your defense (immune) system.   This information is not intended to replace advice given to you by your health care provider. Make sure you discuss any questions you have with your health care provider.   Document Released: 07/27/2005 Document Revised: 04/17/2015 Document Reviewed: 12/05/2014 Elsevier Interactive Patient Education 2016 [employer] Inc[employer]  Dysphagia Diet Level 1, Pureed The dysphasia level 1 diet includes foods that are completely pureed and smooth. The foods have a pudding-like texture, such as the texture of pureed pancakes, mashed potatoes, and yogurt. The diet does not include foods with lumps or coarse textures. Liquids should be smooth and may either be thin, nectar-thick, honey-like, or spoon-thick. This diet is helpful for people with moderate to severe swallowing problems. It reduces the risk of food getting caught in the windpipe, trachea, or lungs. You may need help or supervision during meals while following this diet. WHAT DO I NEED TO KNOW ABOUT THIS DIET? Foods  You may eat foods that are soft and have a pudding-like texture. If a food does not have this texture, you may be able to eat the food after:  Pureeing it. This can be done with a blender or whisk.  Moistening it with liquid. For example, you may have bread if you soak it in milk or syrup.  Avoid foods that are hard, dry, sticky, chunky, lumpy, or stringy. Also avoid foods with nuts, seeds, raisins, skins, and pulp.  Do not eat foods that you have to chew. If you have to chew the food, then you cannot eat it.  Eat a variety of foods to get all the nutrients you need. Liquids  You may drink liquids that are smooth. Your health care provider will tell you if you should drink thin or thickened liquids.  To thicken a liquid, use a food and beverage thickener or a thickening food. Thickened liquids are usually a "pudding-like"  consistency.  Thin liquids include fruit juices, milk, coffee, tea, yogurts, shakes, and similar foods that melt to thin liquid at room temperature.  Avoid liquids with seeds, pulp, or chunks. See your dietitian or health care provider regularly  for help with your dietary changes. WHAT FOODS CAN I EAT? Grains Store-bought soft breads, pancakes, and [place] toast that have a smooth, moist texture and do not have nuts or seeds (you will need to moisten the food with liquid). Cooked cereals that have a pudding-like consistency, such as cream of wheat or farina (no oatmeal). Pureed, well-cooked pasta, rice, and plain bread stuffing. Vegetables Pureed vegetables. Soft avocado. Smooth tomato paste or sauce. Strained or pureed soups (these may need to be thickened as directed). Mashed or pureed potatoes without skin (can be seasoned with butter, smooth gravy, margarine, or sour cream). Fruits Pureed fruits such as melons and apples without seeds or pulp. Mashed bananas. Smooth tomato paste or sauce. Fruit juices without pulp or seeds. Strained or pureed soups. Meat and Other Protein Sources Pureed meat. Smooth pate or liverwurst. Smooth souffles. Pureed beans (such as lentils). Pureed eggs. Dairy Yogurt. Smooth cheese sauces. Milk (may need to be thickened). Nutritional dairy drinks or shakes. Ask your health care provider whether you can have ice cream. Condiments Finely ground salt, pepper, and other ground spices. Sweets/Desserts Smooth puddings and custards. Pureed desserts. Souffles. Whipped topping. Ask your health care provider whether you can have frozen desserts. Fats and Oils Butter. Margarine. Smooth and strained gravy. Sour cream. Mayonnaise. Cream cheese. Whipped topping. Smooth sauces (such as white sauce, cheese sauce, or hollandaise sauce). The items listed above may not be a complete list of recommended foods or beverages. Contact your dietitian for more options. WHAT FOODS ARE NOT  RECOMMENDED? Grains Oatmeal. Dry cereals. Hard breads. Vegetables Whole vegetables. Stringy vegetables (such as celery). Thin tomato sauce. Fruits Whole fresh, frozen, canned, or dried fruits that have not been pureed. Stringy fruits (such as pineapple). Meat and Other Protein Sources Whole or ground meat, fish, or poultry. Dried or cooked lentils or legumes that have been cooked but not mashed or pureed. Non-pureed eggs. Nuts and seeds. Peanut butter. Dairy Non-pureed cheese. Dairy products with lumps or chunks. Ask your health care provider whether you can have ice cream. Condiments Coarse or seeded herbs and spices. Sweets/Desserts [place] preserves. Jams with seeds. Solid desserts. Sticky, chewy sweets (such as licorice and caramel). Ask your health care provider whether you can have frozen desserts. Fats and Oils Sauces of fats with lumps or chunks. The items listed above may not be a complete list of foods and beverages to avoid. Contact your dietitian for more information.   This information is not intended to replace advice given to you by your health care provider. Make sure you discuss any questions you have with your health care provider.   Document Released: 07/27/2005 Document Revised: 08/17/2014 Document Reviewed: 07/10/2013 Elsevier Interactive Patient Education [employer]

## 2015-10-27 NOTE — Progress Notes (Signed)
CSW [place] to [place], as [facility]'s business office is closed and payment arrangements(with family)cannot be made.  Unit CSW has been made aware and will f/u in am.    Pollyann SavoyJody Sahasra Belue, LCSW [employer] [phone]

## 2015-10-27 NOTE — Progress Notes (Signed)
CSW is waiting on return call from [facility] supervisor re: [place]  CSW will continue to follow closely.  Pollyann SavoyJody Oran [place], LCSW [employer] [phone]

## 2015-10-27 NOTE — Discharge Summary (Signed)
Physician Discharge Summary  Lisa Burch  WYO:[id number]  DOB: 1945-10-12  DOA: 10/21/2015  PCP: No primary care provider on file.  Admit date: 10/21/2015 Discharge date: 10/27/2015  Time spent: Greater than 30 minutes  Recommendations for Outpatient Follow-up:  1. [facility] will follow and provide home hospice services.  Discharge Diagnoses:  Principal Problem:   Sacral decubitus ulcer Active Problems:   Frontal lobe dementia   Diabetes mellitus type 2, controlled ([place])   CAD (coronary artery disease)   Sepsis ([place])   Palliative care encounter   Discharge Condition: Improved & Stable  Diet recommendation: Dysphagia 1 diet and thin liquids.  Filed Weights   10/21/15 1825 10/22/15 0428  Weight: 139.708 kg (308 lb) 92.67 kg (204 lb 4.8 oz)    History of present illness:  Patient is a 70 year old female with past medical history of CVA and frontal lobe advanced dementia which has left her bedbound (at least 8 months), nonverbal and pocketing food (at least a month), DM 2, HTN, gout, as well as history of diabetes mellitus and brought in by family for worsening sacral decubitus ulcer. On admission, patient found to be in sepsis with elevated white blood cell count and elevated lactic acid level is 2.43. Patient started on IV antibiotics.   Sepsis stabilized by hospital day 2. Patient seen by wound care and started on hydrotherapy. Had hypoglycemic event 3/16 which quickly rebounded. White count normalized on 3/16. However, patient continues to have poor oral intake and consistently pockets food. Discussed extensively with patient's family and requested palliative care consultation for goals of care. As per discussion with palliative care team and patient's son this morning, family comfortable taking patient home with home hospice. Patient's daughter has arranged hospice services through [facility]. Clinical social worker arranging  transport.  [facility] Hospital Course:   Principal Problem:  Sepsis secondary to Sacral decubitus ulcer  - Present on admission, stage IV./part unstageable.  - Treated with IV antibiotics while patient has inconsistent oral intake.  - Patient met criteria for sepsis given lactic acidosis, leukocytosis, tachycardia and decubitus ulcer source.  - Sepsis physiology resolved. Leukocytosis resolved. - Patient's sacral decubitus ulcer is unlikely to heal or will take a very long time if it ever heals due to bedbound status, poor nutritional status due to reduced oral intake. She is at risk for progressive decline and recurrent sepsis related to this wound. Discussed extensively with patient's son Mr. Lisa Burch at bedside on a daily basis.  - Discussed with patient's son Mr. Lisa Burch at bedside and with palliative care team and family at this time comfortable with taking patient home with home hospice services which have already been arranged by patient's daughter. Clinical social work assisting with arranging transport home. As per discussion with palliative care team, advised to continue antibiotics for sacral decubitus-transitioned to oral doxycycline for additional 1 week although likelihood of this wound healing is poor. - Continue Foley catheter at discharge for comfort and avoid soiling of the sacral decubitus area. - Of note, patient has appeared comfortable and without pain on each of my visits and does not seem to have received much pain medications while hospitalized.  Active Problems:  Frontal lobe dementia/history of CVA:  - Patient's family is actually been quite involved in her care and the plan is for them to go home. Family declines SNF.   Diabetes mellitus type 2, controlled ([place]) with episode of hypoglycemia:  - On large amount of Levemir twice a  day. Due to poor oral intake, Levemir dose had been decreased in the hospital from 40 units to 20 units twice a day. Fasting blood sugar today  in the 80s. We'll further reduce Levemir at discharge to 10 units twice a day to avoid hypoglycemia.   CAD (coronary artery disease) - Stable and asymptomatic. Continue home medications.  Adult failure to thrive/Poor po intake:  - Prior to this hospitalization, family reports patient pocketing food for at least the last 1 month. This could be progression of her dementia exacerbated by infection/hospitalization versus lack of appetite due to infection/antibiotics versus constipation - No change in oral intake status.  - TSH checked by palliative care team and normal.  Constipation - Seen on x-ray. Received Fleet Enema and had a BM on 3/16. Continue stool softeners.  UTI:  - Cultures grew out Escherichia coli, resistant to first generation cephalosporins, sensitive to Rocephin/third-generation. On fourth generation cefepime currently. Patient completed 6 days of IV antibiotics and completed treatment for this indication.  Essential hypertension - Mildly uncontrolled and fluctuating  Sacral and leg wounds - Evaluated in detail by [place] RN on 3/14. Follow recommendations. Wound details as examined on 3/14 are as below: Wound type: Unstageable Pressure Injury right sacrum/R ischium: 7cm x 4cm x 3cm; 80% soft black non viable tissue/20% dark red tissue DTPI (Deep tissue pressure injury) left medial heel: 3cm x 2.5cm x 0cm; dark, purple, blood filled blister Unstageable Pressure Injury right posterior calf: 2.5cm x 3.5cm x 0cm; 100% black stable eschar - Unlikely that patient's wounds will heal and in fact are likely to get worse. Family well aware of this based on several discussions.  Hypokalemia - Replaced.  Hypomagnesemia Replaced.  Acute kidney injury - Resolved.   Anemia - Stable  DO NOT RESUSCITATE   Family Communication: Discussed with patient's son Mr. Lisa Burch net bedside 3/19. Updated care and answered questions. Also discussed with palliative care team, [name]   Consultants:  Wound care   Palliative care team  Procedures:  hydrotherapy by physical therapy   Discharge Exam:  Complaints: Patient is nonverbal. As per son, no change in status or oral intake. He indicates that he has discussed with the rest of his family during palliative care meeting yesterday and they are comfortable taking patient home with home hospice. As per RN, no acute issues.  Filed Vitals:   10/26/15 1400 10/26/15 1440 10/26/15 2223 10/27/15 0645  BP: 184/90 160/68 159/82 160/75  Pulse: 96  101 96  Temp: 99.7 F (37.6 C)  99.6 F (37.6 C) 99.7 F (37.6 C)  TempSrc: Oral  Oral Oral  Resp: _0 Height:      Weight:      SpO2: 96%  97% 96%     General: Moderately built and poorly nourished pleasant elderly female lying comfortably propped up in bed. Does not look septic or toxic. Tracks activity around with her eyes but is nonverbal and does not follow instructions.   Cardiovascular: Regular rate and rhythm, S1-S2   Respiratory: Clear to auscultation bilaterally/poor effort. No increased work of breathing.   Abdomen: Soft, nontender, nondistended, positive bowel sounds   Musculoskeletal: No clubbing or cyanosis, trace edema. Smaller decub ulcers noted on lower extremities  Skin: Examined 3/17: sacral wound examined with RN assistance. Still has foul odor but no significant drainage. Exam details as per picture below. Right leg ischar as per picture        Discharge Instructions  Discharge Instructions    Call MD for:  difficulty breathing, headache or visual disturbances    Complete by:  As directed      Call MD for:  extreme fatigue    Complete by:  As directed      Call MD for:  persistant dizziness or light-headedness    Complete by:  As directed      Call MD for:  persistant nausea and vomiting    Complete by:  As directed      Call MD for:  redness, tenderness, or signs of infection (pain, swelling,  redness, odor or green/yellow discharge around incision site)    Complete by:  As directed      Call MD for:  severe uncontrolled pain    Complete by:  As directed      Call MD for:  temperature >100.4    Complete by:  As directed      Discharge instructions    Complete by:  As directed   DIET: Dysphagia 1 (Puree);Thin liquid (as per instructions provided)  Liquid Administration via: Straw Medication Administration: Crushed with puree Supervision: Staff to assist with self feeding;Full supervision/cueing for compensatory strategies Compensations: Slow rate;Small sips/bites;Minimize environmental distractions;Follow solids with liquid Postural Changes: Seated upright at 90 degrees;Remain upright for at least 30 minutes after po intake      Increase activity slowly    Complete by:  As directed             Medication List    STOP taking these medications        clotrimazole-betamethasone cream  Commonly known as:  LOTRISONE     HYDROcodone-acetaminophen 7.5-325 MG tablet  Commonly known as:  NORCO      TAKE these medications        allopurinol 100 MG tablet  Commonly known as:  ZYLOPRIM  Take 100 mg by mouth daily.     ALPRAZolam 0.5 MG tablet  Commonly known as:  XANAX  Take 0.5 mg by mouth as needed for anxiety.     aspirin 81 MG chewable tablet  Chew 81 mg by mouth daily.     carvedilol 6.25 MG tablet  Commonly known as:  COREG  Take 6.25 mg by mouth 2 (two) times daily with a meal.     cetirizine 10 MG tablet  Commonly known as:  ZYRTEC  Take 10 mg by mouth daily.     colchicine 0.6 MG tablet  Take 0.6 mg by mouth daily.     collagenase ointment  Commonly known as:  SANTYL  Apply topically daily. Apply to sacral wound daily.     cyanocobalamin 500 MCG tablet  Take 500 mcg by mouth daily.     docusate sodium 100 MG capsule  Commonly known as:  COLACE  Take 1 capsule (100 mg total) by mouth 2 (two) times daily.     doxycycline 100 MG tablet   Commonly known as:  VIBRA-TABS  Take 1 tablet (100 mg total) by mouth 2 (two) times daily.     feeding supplement (GLUCERNA SHAKE) Liqd  Take 237 mLs by mouth 2 (two) times daily between meals.     feeding supplement (PRO-STAT SUGAR FREE 64) Liqd  Take 30 mLs by mouth 3 (three) times daily between meals.     Fish Oil 1000 MG Caps  Take 1,000 mg by mouth daily.     insulin detemir 100 UNIT/ML injection  Commonly known as:  LEVEMIR  Inject 0.1  mLs (10 Units total) into the skin 2 (two) times daily.     insulin regular 100 units/mL injection  Commonly known as:  NOVOLIN R,HUMULIN R  Inject 1-15 Units into the skin 3 (three) times daily before meals. Sliding scale     isosorbide mononitrate 30 MG 24 hr tablet  Commonly known as:  IMDUR  Take 30 mg by mouth daily.     multivitamin with minerals Tabs tablet  Take 1 tablet by mouth daily.     oxyCODONE 5 MG/5ML solution  Commonly known as:  ROXICODONE  Take 2.5 mLs (2.5 mg total) by mouth every 6 (six) hours as needed for moderate pain or severe pain.     Vitamin D (Ergocalciferol) 50000 units Caps capsule  Commonly known as:  DRISDOL  Take 50,000 Units by mouth every 7 (seven) days. On mondays       Follow-up Information    Follow up with [facility]   Why:  Please call for any home needs. They will assist you with same.      Get Medicines reviewed and adjusted: Please take all your medications with you for your next visit with your Primary MD  Please request your Primary MD to go over all hospital tests and procedure/radiological results at the follow up. Please ask your Primary MD to get all Hospital records sent to his/her office.  If you experience worsening of your admission symptoms, develop shortness of breath, life threatening emergency, suicidal or homicidal thoughts you must seek medical attention immediately by calling 911 or calling your MD immediately if symptoms less severe.  You must read  complete instructions/literature along with all the possible adverse reactions/side effects for all the Medicines you take and that have been prescribed to you. Take any new Medicines after you have completely understood and accept all the possible adverse reactions/side effects.   Do not drive when taking pain medications.   Do not take more than prescribed Pain, Sleep and Anxiety Medications  Special Instructions: If you have smoked or chewed Tobacco in the last 2 yrs please stop smoking, stop any regular Alcohol and or any Recreational drug use.  Wear Seat belts while driving.  Please note  You were cared for by a hospitalist during your hospital stay. Once you are discharged, your primary care physician will handle any further medical issues. Please note that NO REFILLS for any discharge medications will be authorized once you are discharged, as it is imperative that you return to your primary care physician (or establish a relationship with a primary care physician if you do not have one) for your aftercare needs so that they can reassess your need for medications and monitor your lab values.    The results of significant diagnostics from this hospitalization (including imaging, microbiology, ancillary and laboratory) are listed below for reference.    Significant Diagnostic Studies: Dg Sacrum/coccyx  10/22/2015  CLINICAL DATA:  Decubitus ulceration at the level of the sacrum. Initial encounter. EXAM: SACRUM AND COCCYX - 2+ VIEW COMPARISON:  None. FINDINGS: The patient's known decubitus ulceration is not well characterized on radiograph. Extension to the coccyx cannot be excluded, as the coccyx is difficult to fully characterize on this study. The colon is largely filled with stool. IMPRESSION: 1. Known decubitus ulceration not well characterized. Extension of infection to the coccyx cannot be excluded on this study. 2. Large amount of stool noted in the colon, compatible with  constipation. Electronically Signed   By: Jacqulynn Cadet  Chang M.D.   On: 10/22/2015 00:30   Ct Pelvis Wo Contrast  10/22/2015  CLINICAL DATA:  Worsening sacral decubitus ulceration, with oozing and bleeding. Initial encounter. EXAM: CT PELVIS WITHOUT CONTRAST TECHNIQUE: Multidetector CT imaging of the pelvis was performed following the standard protocol without intravenous contrast. COMPARISON:  Sacrococcygeal radiographs performed earlier today at 12:25 a.m. FINDINGS: A focal decubitus ulceration is noted overlying the right side of the coccyx, with surrounding soft tissue inflammation extending to the coccyx. No definite osseous erosion is seen to suggest osteomyelitis, though evaluation for osteomyelitis is limited on CT. No abscess is seen. Additional diffuse soft tissue inflammation is noted along the lateral abdominal wall and both flanks. Degenerative change is noted along the lower lumbar spine, with loss of the disc space at L5-S1. The sacroiliac joints are grossly unremarkable. Visualized small and large bowel loops are grossly unremarkable. The appendix remains normal in caliber, without evidence of appendicitis. The bladder is mildly distended and grossly unremarkable. The patient is status post hysterectomy. No suspicious adnexal masses are seen. Diffuse calcification is noted along the abdominal aorta and its branches. IMPRESSION: 1. Focal decubitus ulceration overlies the right side of the coccyx, with surrounding soft tissue inflammation extending to the coccyx. No definite osseous erosion seen to suggest osteomyelitis, though evaluation for osteomyelitis is limited on CT. 2. No evidence of abscess. 3. Additional diffuse soft tissue inflammation along the lateral abdominal wall and both flanks. 4. Diffuse vascular calcifications seen. 5. Degenerative change at the lower lumbar spine. Electronically Signed   By: Garald Balding M.D.   On: 10/22/2015 03:05   Dg Abd Portable 1v  10/24/2015  CLINICAL  DATA:  Unable to communicate.  Obstipation.  Diabetes. EXAM: PORTABLE ABDOMEN - 1 VIEW COMPARISON:  10/22/2015 FINDINGS: Prominent stool throughout the colon favors constipation. 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 Lumbar spondylosis. Clips in the right upper quadrant likely from prior cholecystectomy. Vascular calcifications noted. IMPRESSION: 1.  Prominent stool throughout the colon favors constipation. 2. Lumbar spondylosis and scoliosis. Electronically Signed   By: Van Clines M.D.   On: 10/24/2015 14:10    Microbiology: Recent Results (from the past 240 hour(s))  Blood culture (routine x 2)     Status: None (Preliminary result)   Collection Time: 10/21/15 11:47 PM  Result Value Ref Range Status   Specimen Description BLOOD RIGHT FOREARM  Final   Special Requests IN PEDIATRIC BOTTLE 3ML  Final   Culture NO GROWTH 4 DAYS  Final   Report Status PENDING  Incomplete  Blood culture (routine x 2)     Status: None (Preliminary result)   Collection Time: 10/21/15 11:52 PM  Result Value Ref Range Status   Specimen Description BLOOD RIGHT HAND  Final   Special Requests BOTTLES DRAWN AEROBIC AND ANAEROBIC 5ML  Final   Culture NO GROWTH 4 DAYS  Final   Report Status PENDING  Incomplete  Urine culture     Status: None   Collection Time: 10/22/15 12:57 AM  Result Value Ref Range Status   Specimen Description URINE, CATHETERIZED  Final   Special Requests NONE  Final   Culture >=100,000 COLONIES/mL ESCHERICHIA COLI  Final   Report Status 10/24/2015 FINAL  Final   Organism ID, Bacteria ESCHERICHIA COLI  Final      Susceptibility   Escherichia coli - MIC*    AMPICILLIN >=32 RESISTANT Resistant     CEFAZOLIN 32 INTERMEDIATE Intermediate     CEFTRIAXONE <=1 SENSITIVE Sensitive  CIPROFLOXACIN >=4 RESISTANT Resistant     GENTAMICIN <=1 SENSITIVE Sensitive     IMIPENEM <=0.25 SENSITIVE Sensitive     NITROFURANTOIN <=16 SENSITIVE Sensitive     TRIMETH/SULFA >=320  RESISTANT Resistant     AMPICILLIN/SULBACTAM >=32 RESISTANT Resistant     PIP/TAZO >=128 RESISTANT Resistant     * >=100,000 COLONIES/mL ESCHERICHIA COLI     Labs: Basic Metabolic Panel:  Recent Labs Lab 10/21/15 1905 10/22/15 0437 10/23/15 1030 10/24/15 0435 10/25/15 0420 10/25/15 1311  NA 137 136 141 140 140  --   K 3.7 2.8* 2.9* 3.4* 4.0  --   CL 98* 101 107 108 111  --   CO2 _0 --   GLUCOSE 197* 146* 114* 87 119*  --   BUN _1 --   CREATININE 1.20* 1.09* 0.54 0.50 0.52  --   CALCIUM 9.3 8.4* 8.5* 8.3* 8.2*  --   MG  --   --   --  1.5*  --  1.6*   Liver Function Tests:  Recent Labs Lab 10/21/15 1905  AST 31  ALT 22  ALKPHOS 98  BILITOT 0.8  PROT 6.9  ALBUMIN 2.7*   No results for input(s): LIPASE, AMYLASE in the last 168 hours. No results for input(s): AMMONIA in the last 168 hours. CBC:  Recent Labs Lab 10/21/15 1905 10/22/15 0437 10/23/15 0820 10/24/15 0435 10/25/15 0420  WBC 15.6* 13.9* 14.4* 10.4 9.9  NEUTROABS 11.2*  --   --   --   --   HGB 12.9 11.6* 12.7 10.2* 9.9*  HCT 41.2 37.9 41.1 33.6* 33.1*  MCV 79.4 79.0 80.6 80.8 81.1  PLT 466* 406* 368 332 333   Cardiac Enzymes: No results for input(s): CKTOTAL, CKMB, CKMBINDEX, TROPONINI in the last 168 hours. BNP: BNP (last 3 results) No results for input(s): BNP in the last 8760 hours.  ProBNP (last 3 results) No results for input(s): PROBNP in the last 8760 hours.  CBG:  Recent Labs Lab 10/26/15 0746 10/26/15 1142 10/26/15 1742 10/26/15 2215 10/27/15 0811  GLUCAP 90 136* 127* 123* 84       Signed:  Vernell Leep, MD, FACP, FHM. [facility] Pager [phone]  If 7PM-7AM, please contact night-coverage [email or web address] Password [email or web address] 10/27/2015, 11:40 AM

## 2015-10-27 NOTE — Progress Notes (Signed)
Daily Progress Note   Patient Name: Lisa Burch       Date: 10/27/2015 DOB: [birth year]  Age: 70 y.o. MRN#: [id number] Attending Physician: Modena Jansky, MD Primary Care Physician: No primary care provider on file. Admit Date: 10/21/2015  Reason for Consultation/Follow-up: Establishing goals of care and Pain control  Subjective: Patient's clinical status essentially unchanged. She is pocketing food, minimally responsive. She will open her eyes to voice but remains nonverbal. Have observed no volitional movement of her extremities. I am beginning to hear her cough after eating, again family recognizes that she is a high risk for aspiration andthat they have feeding for comfort. She does have a low-grade temperature today of 99.7. A TSH was drawn and it was within normal limits. Met with patient's son [name], and spoke to her daughter [name], on the phone, and [name] had already arranged hospice to come to the home through [facility] and already familiarized herself with an inpatient unit near her house. I also met with patient's siblings again today. They're concerned about patient going back home and in that it is too much care for [place] to do, but recognizes Charna Busman to feel as though she is done everything for her mother. [name] is supportive of his sister but is also concerned about his mother's level of care. All are present in agreement with going home with hospice helping [name] in the home as well as [name] has arranged for approximately 4 hours a day for private assistance. I would not be surprised however if very quickly [name] is able to let an inpatient hospice help her care for her mother. Reiterated to the family that I feel that patient could have days to  weeks to live. End-of-life likely related to ongoing sepsis secondary to sacral wound as well as aspiration pneumonia. Interval Events: No clinical improvement Length of Stay: 4 days  Current Medications: Scheduled Meds:  . allopurinol  100 mg Oral Daily  . aspirin  81 mg Oral Daily  . carvedilol  6.25 mg Oral BID WC  . ceFEPime (MAXIPIME) IV  2 g Intravenous Q12H  . colchicine  0.6 mg Oral Daily  . collagenase   Topical Daily  . docusate sodium  100 mg Oral BID  . feeding supplement (GLUCERNA SHAKE)  237 mL Oral BID BM  .  feeding supplement (PRO-STAT SUGAR FREE 64)  30 mL Oral TID BM  . insulin aspart  0-9 Units Subcutaneous TID WC  . insulin detemir  15 Units Subcutaneous BID  . isosorbide mononitrate  30 mg Oral Daily  . loratadine  10 mg Oral Daily  . megestrol  400 mg Oral Daily  . multivitamin with minerals  1 tablet Oral Daily  . omega-3 acid ethyl esters  1 g Oral BID  . sodium phosphate  1 enema Rectal Once  . vancomycin  1,500 mg Intravenous Q24H  . cyanocobalamin  500 mcg Oral Daily  . [START ON 10/28/2015] Vitamin D (Ergocalciferol)  50,000 Units Oral Q7 days    Continuous Infusions: . 0.9 % NaCl with KCl 40 mEq / L 50 mL/hr (10/26/15 2217)    PRN Meds: acetaminophen **OR** acetaminophen, ALPRAZolam, dextrose, ondansetron **OR** ondansetron (ZOFRAN) IV, oxyCODONE  Physical Exam: Physical Exam  Constitutional: She appears well-developed.  Pulmonary/Chest: Effort normal.  Abdominal: She exhibits distension.  Neurological:  Minimally responsive. Opens her eyes to voice. Non-verbal  Skin: Skin is warm and dry.                Vital Signs: BP 160/75 mmHg  Pulse 96  Temp(Src) 99.7 F (37.6 C) (Oral)  Resp 16  Ht '5\' 11"'  (1.803 m)  Wt 92.67 kg (204 lb 4.8 oz)  BMI 28.51 kg/m2  SpO2 96% SpO2: SpO2: 96 % O2 Device: O2 Device: Not Delivered O2 Flow Rate:    Intake/output summary:  Intake/Output Summary (Last 24 hours) at 10/27/15 1320 Last data filed at  10/27/15 1100  Gross per 24 hour  Intake   2230 ml  Output   2700 ml  Net   -470 ml   LBM: Last BM Date: 10/23/15 Baseline Weight: Weight: (!) 139.708 kg (308 lb) Most recent weight: Weight: 92.67 kg (204 lb 4.8 oz)       Palliative Assessment/Data: Flowsheet Rows        Most Recent Value   Intake Tab    Referral Department  Hospitalist   Unit at Time of Referral  Med/Surg Unit   Palliative Care Primary Diagnosis  Sepsis/Infectious Disease   Date Notified  10/25/15   Palliative Care Type  New Palliative care   Reason for referral  Clarify Goals of Care   Date of Admission  10/21/15   Date first seen by Palliative Care  10/26/15   # of days Palliative referral response time  1 Day(s)   # of days IP prior to Palliative referral  4   Clinical Assessment    Palliative Performance Scale Score  30%   Pain Max last 24 hours  Not able to report   Pain Min Last 24 hours  Not able to report   Dyspnea Max Last 24 Hours  Not able to report   Dyspnea Min Last 24 hours  Not able to report   Nausea Max Last 24 Hours  Not able to report   Nausea Min Last 24 Hours  Not able to report   Anxiety Max Last 24 Hours  Not able to report   Anxiety Min Last 24 Hours  Not able to report   Other Max Last 24 Hours  Not able to report   Psychosocial & Spiritual Assessment    Palliative Care Outcomes    Patient/Family meeting held?  Yes   Who was at the meeting?  son, 2 daughters, and granddaughter   Palliative Care Outcomes  Improved pain interventions   Palliative Care follow-up planned  Yes, Facility      Additional Data Reviewed: CBC    Component Value Date/Time   WBC 9.9 10/25/2015 0420   RBC 4.08 10/25/2015 0420   HGB 9.9* 10/25/2015 0420   HCT 33.1* 10/25/2015 0420   PLT 333 10/25/2015 0420   MCV 81.1 10/25/2015 0420   MCH 24.3* 10/25/2015 0420   MCHC 29.9* 10/25/2015 0420   RDW 19.1* 10/25/2015 0420   LYMPHSABS 3.0 10/21/2015 1905   MONOABS 1.3* 10/21/2015 1905   EOSABS 0.1  10/21/2015 1905   BASOSABS 0.1 10/21/2015 1905    CMP     Component Value Date/Time   NA 140 10/25/2015 0420   K 4.0 10/25/2015 0420   CL 111 10/25/2015 0420   CO2 23 10/25/2015 0420   GLUCOSE 119* 10/25/2015 0420   BUN 11 10/25/2015 0420   CREATININE 0.52 10/25/2015 0420   CALCIUM 8.2* 10/25/2015 0420   PROT 6.9 10/21/2015 1905   ALBUMIN 2.7* 10/21/2015 1905   AST 31 10/21/2015 1905   ALT 22 10/21/2015 1905   ALKPHOS 98 10/21/2015 1905   BILITOT 0.8 10/21/2015 1905   GFRNONAA >60 10/25/2015 0420   GFRAA >60 10/25/2015 0420       Problem List:  Patient Active Problem List   Diagnosis Date Noted  . Palliative care encounter   . Sepsis ([place]) 10/23/2015  . Sacral decubitus ulcer 10/22/2015  . 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 Diabetes mellitus type 2, controlled ([place]) 10/22/2015  . CAD (coronary artery disease) 10/22/2015  . Stage 4 decubitus ulcer ([place])      Palliative Care Assessment & Plan    1.Code Status:  DNR    Code Status Orders        Start     Ordered   10/22/15 0414  Do not attempt resuscitation (DNR)   Continuous    Question Answer Comment  In the event of cardiac or respiratory ARREST Do not call a "code blue"   In the event of cardiac or respiratory ARREST Do not perform Intubation, CPR, defibrillation or ACLS   In the event of cardiac or respiratory ARREST Use medication by any route, position, wound care, and other measures to relive pain and suffering. May use oxygen, suction and manual treatment of airway obstruction as needed for comfort.      10/22/15 0413    Code Status History    Date Active Date Inactive Code Status Order ID Comments User Context   10/22/2015 12:36 AM 10/22/2015  4:13 AM DNR [id number]  Merryl Hacker, MD ED       2. Goals of Care/Additional Recommendations:  Discharge home when transportation can be arranged to [facility], [employer] work involved trying to arrange transportation  but since it Sunday we have been unable to contact any business offices with these transportation services in order to prepare family for what likely could be the financial charges  Limitations on Scope of Treatment:  Continue antibiotics and supportive treatment until discharge  Desire for further Chaplaincy support:no  Psycho-social Needs: Grief/Bereavement Support  3. Symptom Management:      1. Pain: Continue with oxycodone liquid 2.5 mg to 5 mg every 4 as needed. Thus far she has not used 1 dose  4. Palliative Prophylaxis:   Aspiration, Bowel Regimen, Frequent Pain Assessment and Turn Reposition  5. Prognosis: < 4 weeks  6. Discharge Planning:  Home with Hospice  Care plan was discussed with Dr. Janifer Adie  Thank you for allowing the Palliative Medicine Team to assist in the care of this patient.   Time In: 0830 Time Out: 1000 Total Time 90 min Prolonged Time Billed  no         Dory Horn, NP  10/27/2015, 1:20 PM  Please contact Palliative Medicine Team phone at [phone] for questions and concerns.

## 2015-10-28 DIAGNOSIS — L89150 Pressure ulcer of sacral region, unstageable: Secondary | ICD-10-CM

## 2015-10-28 DIAGNOSIS — Z794 Long term (current) use of insulin: Secondary | ICD-10-CM

## 2015-10-28 DIAGNOSIS — E119 Type 2 diabetes mellitus without complications: Secondary | ICD-10-CM

## 2015-10-28 LAB — GLUCOSE, CAPILLARY
GLUCOSE-CAPILLARY: 92 mg/dL (ref 65–99)
GLUCOSE-CAPILLARY: 109 mg/dL — AB (ref 65–99)
Glucose-Capillary: 96 mg/dL (ref 65–99)

## 2015-10-28 MED ORDER — DAKINS (1/4 STRENGTH) 0.125 % EX SOLN: Freq: Two times a day (BID) | CUTANEOUS | Status: AC

## 2015-10-28 MED ORDER — DAKINS (1/4 STRENGTH) 0.125 % EX SOLN
Freq: Two times a day (BID) | CUTANEOUS | Status: DC
  Administered 2015-10-28: 12:00:00
  Filled 2015-10-28: qty 473

## 2015-10-28 NOTE — Progress Notes (Signed)
PMT RN f/u: RN Case Manager has met with family. Pt is being worked up for [employer]. She is completely non-verbal with PMT RN and answers no questions. Family is going [place], as the family is now concerned that pt's needs are too great for home care. Plan f/u later if needed; otherwise, PMT will defer to CM and SW planning. Larina Earthly, RN, BSN, [facility] 10/28/2015 10:49 AM Cell [phone] 8:00-4:00 Monday-Friday Office [phone]

## 2015-10-28 NOTE — Progress Notes (Signed)
Physical Therapy Wound Treatment Patient Details  Name: Lisa Burch MRN: [id number] Date of Birth: 10-Jul-1946  Today's Date: 10/28/2015 Time: 0950-1029 Time Calculation (min): 39 min  Subjective  Subjective: Pt aphasic Patient and Family Stated Goals: Pt's son present for session  Date of Onset:  (Prior to hospitalization)  Pain Score:  Pt unable to verbalize pain score during session. Appeared to be somewhat uncomfortable at times.  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 Pt has 3 inch full thickness pressure ulcer to sacral area (Active)  Dressing Type Gauze (Comment);Moist to dry 10/28/2015 10:36 AM  Dressing Clean;Dry;Intact 10/28/2015 10:36 AM  Dressing Change Frequency Daily 10/28/2015 10:36 AM  State of Healing [place] 10/28/2015 10:36 AM  Site / Wound Assessment Red;Black;Brown;Yellow;Bleeding 10/28/2015 10:36 AM  % Wound base Red or Granulating 45% 10/28/2015 10:36 AM  % Wound base Yellow 35% 10/28/2015 10:36 AM  % Wound base Black 20% 10/28/2015 10:36 AM  % Wound base Other (Comment) 5% 10/27/2015 12:30 PM  Peri-wound Assessment Intact 10/28/2015 10:36 AM  Wound Length (cm) 9 cm 10/22/2015 11:57 AM  Wound Width (cm) 7 cm 10/22/2015 11:57 AM  Wound Depth (cm) 3 cm 10/22/2015 11:57 AM  Tunneling (cm) 2 10/22/2015  4:00 AM  Margins Unattached edges (unapproximated) 10/28/2015 10:36 AM  Drainage Amount Minimal 10/28/2015 10:36 AM  Drainage Description Purulent;Green 10/28/2015 10:36 AM  Treatment Debridement (Selective);Hydrotherapy (Pulse lavage);Packing (Saline gauze) 10/28/2015 10:36 AM  Santyl applied to wound bed prior to applying dressing.   Hydrotherapy Pulsed lavage therapy - wound location: sacrum Pulsed Lavage with Suction (psi): 8 psi Pulsed Lavage with Suction - Normal Saline Used: 1000 mL Pulsed Lavage Tip: Tip with splash shield Selective Debridement Selective Debridement - Location:  sacrum Selective Debridement - Tools Used: Forceps;Scissors Selective Debridement - Tissue Removed: brown, black, yellow necrotic tissue   Wound Assessment and Plan  Wound Therapy - Assess/Plan/Recommendations Wound Therapy - Clinical Statement: Progressing with removal of necrotic tissue. Pt. with some facial grimacing today during hydrotherapy, was premedicated. Increased time needed due to pt with food present in mouth on arrival with increased time needed to clear mouth to be safe for lowering head of bed. Also, pt with incontinent of urine at end of session with increased time needed to clean her up.                                         Wound Therapy - Functional Problem List: Decr sitting Factors Delaying/Impairing Wound Healing: Diabetes Mellitus;Incontinence;Immobility;Multiple medical problems;Polypharmacy Hydrotherapy Plan: Debridement;Dressing change;Patient/family education;Pulsatile lavage with suction Wound Therapy - Frequency: 6X / week Wound Therapy - Follow Up Recommendations: Other (comment) (Dependent on family plans for d/c destination) Wound Plan: See above  Wound Therapy Goals- Improve the function of patient's integumentary system by progressing the wound(s) through the phases of wound healing (inflammation - proliferation - remodeling) by: Decrease Necrotic Tissue to: 30 Decrease Necrotic Tissue - Progress: Progressing toward goal Increase Granulation Tissue to: 70 Increase Granulation Tissue - Progress: Progressing toward goal Time For Goal Achievement: 7 days Wound Therapy - Potential for Goals: Good  Goals will be updated until maximal potential achieved or discharge criteria met.  Discharge criteria: when goals achieved, discharge from hospital, MD decision/surgical intervention, no progress towards goals, refusal/missing three consecutive treatments without notification or medical reason.  GP  Rolinda Roan 10/28/2015, 10:46 AM   Rolinda Roan, PT,  DPT Acute Rehabilitation Services Pager: [phone]

## 2015-10-28 NOTE — Care Management (Signed)
Patient has been discharged . Son Lisa Burch stepped away from bedside while hydrotherapy in progress.   Spoke with patient's daughter Lisa Burch [phone] , in beginning of conservation Lisa Burch wanted to [facility] her [facility] [phone] via ambulance  To [address] ,[place] [id number] . However, as conservation continued Lisa Burch expressed , patient's mother ( 72 years old ) and 2 sisters and son live in [place] and want her to stay in [place] and go [facility] . Lisa Burch would like more information on [facility]. [facility]urch with [facility] aware and will call [facility].    Patient's son Lisa Burch returned to patient's room . Above explained to Lisa Burch . He is unsure which he wants . He is aware patient is discharged today and family needs to make a decision . Lisa Burch went to [facility].   Lisa FlurryHeather Honorio Devol RN BSN [phone]

## 2015-10-28 NOTE — Care Management (Signed)
Patient's daughter Judeth CornfieldStephanie and sister Britta MccreedyBarbara requesting [facility] . [facility] to [facility] and accepted . Dr Mahala MenghiniSamtani , bedside nurse , Judeth CornfieldStephanie and Lonni FixNolan all aware.  Ronny FlurryHeather Nelwyn Hebdon RN BSN [phone]

## 2015-10-28 NOTE — Discharge Summary (Signed)
Physician Discharge Summary  Lisa Burch  PJK:[id number]  DOB: Mar 15, 1946  DOA: 10/21/2015  PCP: No primary care provider on file.  Admit date: 10/21/2015 Discharge date: 10/28/2015  Time spent: Greater than 30 minutes  Recommendations for Outpatient Follow-up:  1. Patient to follow up at [facility] 2. Family vacillated between Full care and Hospice-[facility] was offered to family vs home health with Palliative following on d/c-- patient's family requested transfer to [facility] facility to attempt short term IV antibiotics as well as Hydrotherapy to the back 3. Long discussion to clarify with Daughter Lisa Burch prior to d/c to [facility] DNR, however if no improvement in 2 weeks, considering palilatve or Hospice care  4. Very poor prognosis likely.  Recommend re-visit Goals of care within the next week   Discharge Diagnoses:  Principal Problem:   Sacral decubitus ulcer Active Problems:   Frontal lobe dementia   Diabetes mellitus type 2, controlled ([place])   CAD (coronary artery disease)   Sepsis ([place])   Palliative care encounter   Discharge Condition: Improved & Stable  Diet recommendation: Dysphagia 1 diet and thin liquids.  Filed Weights   10/21/15 1825 10/22/15 0428  Weight: 139.708 kg (308 lb) 92.67 kg (204 lb 4.8 oz)    History of present illness:  Patient is a 70 year old female with past medical history of CVA and frontal lobe advanced dementia which has left her bedbound (at least 8 months), nonverbal and pocketing food (at least a month), DM 2, HTN, gout, as well as history of diabetes mellitus and brought in by family for worsening sacral decubitus ulcer. On admission, patient found to be in sepsis with elevated white blood cell count and elevated lactic acid level is 2.43. Patient started on IV antibiotics.   Sepsis stabilized by hospital day 2. Patient seen by wound care and started on hydrotherapy. Had hypoglycemic event 3/16 which quickly rebounded.  White count normalized on 3/16. However, patient continues to have poor oral intake and consistently pockets food. Discussed extensively with patient's family and requested palliative care consultation for goals of care.   Initially family decided on Hospice, but then decided to pursue [facility] care for one more try  Hospital Course:    Sepsis secondary to Sacral decubitus ulcer  - Present on admission, stage IV part unstageable.  - Treated with IV antibiotics while patient has inconsistent oral intake.  - Patient met criteria for sepsis given lactic acidosis, leukocytosis, tachycardia and decubitus ulcer source.  - Sepsis physiology resolved. Leukocytosis resolved. - Patient's sacral decubitus ulcer is unlikely to heal or will take a very long time if it ever heals due to bedbound status, poor nutritional status due to reduced oral intake. She is at risk for progressive decline and recurrent sepsis related to this wound. - patient discussed extensively with patient's son Mr. Lisa Burch at bedside on a daily basis.  - Discussed with patient's son Mr. Lisa Burch at bedside and with palliative care team . - Family vacillated between Hospice and FULL code status - Continue Foley catheter at discharge for comfort and avoid soiling of the sacral decubitus area. - Of note, patient has appeared comfortable and without pain on each of my visits and does not seem to have received much pain medications while hospitalized. -was on doxycycline, family electing to seek care at [place]  -was lavaged in Hospital -Further care as per [facility]   Frontal lobe dementia/history of CVA:  - Patient's family involved in her care   Diabetes mellitus  type 2, controlled ([place]) with episode of hypoglycemia:  - On large amount of Levemir twice a day. Due to poor oral intake, Levemir dose had been decreased in the hospital from 40 units to 20 units twice a day. Fasting blood sugar today in the 80s.   Levemir at  discharge to 10 units twice a day to avoid hypoglycemia.   CAD (coronary artery disease) - Stable and asymptomatic. Continue home medications.  Adult failure to thrive/Poor po intake:  - Prior to this hospitalization, family reports patient pocketing food for at least the last 1 month. This could be progression of her dementia exacerbated by infection/hospitalization versus lack of appetite due to infection/antibiotics versus constipation  - No change in oral intake status.  - TSH checked by palliative care team and normal.  Constipation - Seen on x-ray. Received Fleet Enema and had a BM on 3/16. Continue stool softeners.  UTI:  - Cultures grew out Escherichia coli, resistant to first generation cephalosporins, sensitive to Rocephin/third-generation. On fourth generation cefepime currently.  Patient completed 6 days of IV antibiotics and completed treatment for this indication.  Essential hypertension - Mildly uncontrolled and fluctuating  Sacral and leg wounds - Evaluated in detail by [place] RN on 3/14. Follow recommendations. Wound details as examined on 3/14 are as below: Wound type: Unstageable Pressure Injury right sacrum/R ischium: 7cm x 4cm x 3cm; 80% soft black non viable tissue/20% dark red tissue DTPI (Deep tissue pressure injury) left medial heel: 3cm x 2.5cm x 0cm; dark, purple, blood filled blister Unstageable Pressure Injury right posterior calf: 2.5cm x 3.5cm x 0cm; 100% black stable eschar - Unlikely that patient's wounds will heal and in fact are likely to get worse. Family well aware of this based on several discussions. -recommend lavage therapy if prn at [facility] -otherwise Dakin's solution  Hypokalemia - Replaced.  Hypomagnesemia Replaced.  Acute kidney injury - Resolved.   Anemia - Stable  DO NOT RESUSCITATE   Family Communication:  Discussed with patient's son Mr. Lisa Burch net bedside 3/19.  Discussed with Daughter Lisa Burch on phone  10/28/15   Consultants:  Wound care   Palliative care team  Procedures:  hydrotherapy by physical therapy   Discharge Exam:  Complaints: Patient is nonverbal. Non-responsive  Filed Vitals:   10/27/15 2245 10/28/15 0610 10/28/15 1336 10/28/15 1412  BP: 167/79 169/74 236/119 180/82  Pulse: 99 93 109   Temp: 99.5 F (37.5 C) 99.3 F (37.4 C) 98.4 F (36.9 C)   TempSrc: Oral Oral Oral   Resp: _0 Height:      Weight:      SpO2: 97% 97% 95%      General: Moderately built and poorly nourished pleasant elderly female lying comfortably propped up in bed. Does not look septic or toxic. Tracks activity around with her eyes but is nonverbal and does not follow instructions.   Cardiovascular: Regular rate and rhythm, S1-S2   Respiratory: Clear to auscultation bilaterally/poor effort. No increased work of breathing.   Abdomen: Soft, nontender, nondistended, positive bowel sounds   Musculoskeletal: No clubbing or cyanosis, trace edema. Smaller decub ulcers noted on lower extremities  Skin: Examined 3/17: sacral wound examined with RN assistance. Still has foul odor but no significant drainage. Exam details as per picture below. Right leg ischar as per picture   Discharge Instructions      Discharge Instructions    Call MD for:  difficulty breathing, headache or visual disturbances    Complete  by:  As directed      Call MD for:  extreme fatigue    Complete by:  As directed      Call MD for:  persistant dizziness or light-headedness    Complete by:  As directed      Call MD for:  persistant nausea and vomiting    Complete by:  As directed      Call MD for:  redness, tenderness, or signs of infection (pain, swelling, redness, odor or green/yellow discharge around incision site)    Complete by:  As directed      Call MD for:  severe uncontrolled pain    Complete by:  As directed      Call MD for:  temperature >100.4    Complete by:  As directed       Discharge instructions    Complete by:  As directed   DIET: Dysphagia 1 (Puree);Thin liquid (as per instructions provided)  Liquid Administration via: Straw Medication Administration: Crushed with puree Supervision: Staff to assist with self feeding;Full supervision/cueing for compensatory strategies Compensations: Slow rate;Small sips/bites;Minimize environmental distractions;Follow solids with liquid Postural Changes: Seated upright at 90 degrees;Remain upright for at least 30 minutes after po intake      Increase activity slowly    Complete by:  As directed             Medication List    STOP taking these medications        clotrimazole-betamethasone cream  Commonly known as:  LOTRISONE     HYDROcodone-acetaminophen 7.5-325 MG tablet  Commonly known as:  NORCO      TAKE these medications        allopurinol 100 MG tablet  Commonly known as:  ZYLOPRIM  Take 100 mg by mouth daily.     ALPRAZolam 0.5 MG tablet  Commonly known as:  XANAX  Take 0.5 mg by mouth as needed for anxiety.     aspirin 81 MG chewable tablet  Chew 81 mg by mouth daily.     carvedilol 6.25 MG tablet  Commonly known as:  COREG  Take 6.25 mg by mouth 2 (two) times daily with a meal.     cetirizine 10 MG tablet  Commonly known as:  ZYRTEC  Take 10 mg by mouth daily.     colchicine 0.6 MG tablet  Take 0.6 mg by mouth daily.     collagenase ointment  Commonly known as:  SANTYL  Apply topically daily. Apply to sacral wound daily.     cyanocobalamin 500 MCG tablet  Take 500 mcg by mouth daily.     docusate sodium 100 MG capsule  Commonly known as:  COLACE  Take 1 capsule (100 mg total) by mouth 2 (two) times daily.     feeding supplement (GLUCERNA SHAKE) Liqd  Take 237 mLs by mouth 2 (two) times daily between meals.     feeding supplement (PRO-STAT SUGAR FREE 64) Liqd  Take 30 mLs by mouth 3 (three) times daily between meals.     Fish Oil 1000 MG Caps  Take 1,000 mg by mouth daily.      insulin detemir 100 UNIT/ML injection  Commonly known as:  LEVEMIR  Inject 0.1 mLs (10 Units total) into the skin 2 (two) times daily.     insulin regular 100 units/mL injection  Commonly known as:  NOVOLIN R,HUMULIN R  Inject 1-15 Units into the skin 3 (three) times daily before meals. Sliding scale  isosorbide mononitrate 30 MG 24 hr tablet  Commonly known as:  IMDUR  Take 30 mg by mouth daily.     multivitamin with minerals Tabs tablet  Take 1 tablet by mouth daily.     oxyCODONE 5 MG/5ML solution  Commonly known as:  ROXICODONE  Take 2.5 mLs (2.5 mg total) by mouth every 6 (six) hours as needed for moderate pain or severe pain.     sodium hypochlorite 0.125 % Soln  Commonly known as:  DAKIN'S 1/4 STRENGTH  Irrigate with as directed 2 (two) times daily. To back sacral decubitus     Vitamin D (Ergocalciferol) 50000 units Caps capsule  Commonly known as:  DRISDOL  Take 50,000 Units by mouth every 7 (seven) days. On mondays       Please note  You were cared for by a hospitalist during your hospital stay. Once you are discharged, your primary care physician will handle any further medical issues. Please note that NO REFILLS for any discharge medications will be authorized once you are discharged, as it is imperative that you return to your primary care physician (or establish a relationship with a primary care physician if you do not have one) for your aftercare needs so that they can reassess your need for medications and monitor your lab values.    The results of significant diagnostics from this hospitalization (including imaging, microbiology, ancillary and laboratory) are listed below for reference.    Significant Diagnostic Studies: Dg Sacrum/coccyx  10/22/2015  CLINICAL DATA:  Decubitus ulceration at the level of the sacrum. Initial encounter. EXAM: SACRUM AND COCCYX - 2+ VIEW COMPARISON:  None. FINDINGS: The patient's known decubitus ulceration is not well  characterized on radiograph. Extension to the coccyx cannot be excluded, as the coccyx is difficult to fully characterize on this study. The colon is largely filled with stool. IMPRESSION: 1. Known decubitus ulceration not well characterized. Extension of infection to the coccyx cannot be excluded on this study. 2. Large amount of stool noted in the colon, compatible with constipation. Electronically Signed   By: Garald Balding M.D.   On: 10/22/2015 00:30   Ct Pelvis Wo Contrast  10/22/2015  CLINICAL DATA:  Worsening sacral decubitus ulceration, with oozing and bleeding. Initial encounter. EXAM: CT PELVIS WITHOUT CONTRAST TECHNIQUE: Multidetector CT imaging of the pelvis was performed following the standard protocol without intravenous contrast. COMPARISON:  Sacrococcygeal radiographs performed earlier today at 12:25 a.m. FINDINGS: A focal decubitus ulceration is noted overlying the right side of the coccyx, with surrounding soft tissue inflammation extending to the coccyx. No definite osseous erosion is seen to suggest osteomyelitis, though evaluation for osteomyelitis is limited on CT. No abscess is seen. Additional diffuse soft tissue inflammation is noted along the lateral abdominal wall and both flanks. Degenerative change is noted along the lower lumbar spine, with loss of the disc space at L5-S1. The sacroiliac joints are grossly unremarkable. Visualized small and large bowel loops are grossly unremarkable. The appendix remains normal in caliber, without evidence of appendicitis. The bladder is mildly distended and grossly unremarkable. The patient is status post hysterectomy. No suspicious adnexal masses are seen. Diffuse calcification is noted along the abdominal aorta and its branches. IMPRESSION: 1. Focal decubitus ulceration overlies the right side of the coccyx, with surrounding soft tissue inflammation extending to the coccyx. No definite osseous erosion seen to suggest osteomyelitis, though  evaluation for osteomyelitis is limited on CT. 2. No evidence of abscess. 3. Additional diffuse soft tissue inflammation along  the lateral abdominal wall and both flanks. 4. Diffuse vascular calcifications seen. 5. Degenerative change at the lower lumbar spine. Electronically Signed   By: Garald Balding M.D.   On: 10/22/2015 03:05   Dg Abd Portable 1v  10/24/2015  CLINICAL DATA:  Unable to communicate.  Obstipation.  Diabetes. EXAM: PORTABLE ABDOMEN - 1 VIEW COMPARISON:  10/22/2015 FINDINGS: Prominent stool throughout the colon favors constipation. 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 Lumbar spondylosis. Clips in the right upper quadrant likely from prior cholecystectomy. Vascular calcifications noted. IMPRESSION: 1.  Prominent stool throughout the colon favors constipation. 2. Lumbar spondylosis and scoliosis. Electronically Signed   By: Van Clines M.D.   On: 10/24/2015 14:10    Microbiology: Recent Results (from the past 240 hour(s))  Blood culture (routine x 2)     Status: None   Collection Time: 10/21/15 11:47 PM  Result Value Ref Range Status   Specimen Description BLOOD RIGHT FOREARM  Final   Special Requests IN PEDIATRIC BOTTLE 3ML  Final   Culture NO GROWTH 5 DAYS  Final   Report Status 10/27/2015 FINAL  Final  Blood culture (routine x 2)     Status: None   Collection Time: 10/21/15 11:52 PM  Result Value Ref Range Status   Specimen Description BLOOD RIGHT HAND  Final   Special Requests BOTTLES DRAWN AEROBIC AND ANAEROBIC 5ML  Final   Culture NO GROWTH 5 DAYS  Final   Report Status 10/27/2015 FINAL  Final  Urine culture     Status: None   Collection Time: 10/22/15 12:57 AM  Result Value Ref Range Status   Specimen Description URINE, CATHETERIZED  Final   Special Requests NONE  Final   Culture >=100,000 COLONIES/mL ESCHERICHIA COLI  Final   Report Status 10/24/2015 FINAL  Final   Organism ID, Bacteria ESCHERICHIA COLI  Final      Susceptibility    Escherichia coli - MIC*    AMPICILLIN >=32 RESISTANT Resistant     CEFAZOLIN 32 INTERMEDIATE Intermediate     CEFTRIAXONE <=1 SENSITIVE Sensitive     CIPROFLOXACIN >=4 RESISTANT Resistant     GENTAMICIN <=1 SENSITIVE Sensitive     IMIPENEM <=0.25 SENSITIVE Sensitive     NITROFURANTOIN <=16 SENSITIVE Sensitive     TRIMETH/SULFA >=320 RESISTANT Resistant     AMPICILLIN/SULBACTAM >=32 RESISTANT Resistant     PIP/TAZO >=128 RESISTANT Resistant     * >=100,000 COLONIES/mL ESCHERICHIA COLI     Labs: Basic Metabolic Panel:  Recent Labs Lab 10/21/15 1905 10/22/15 0437 10/23/15 1030 10/24/15 0435 10/25/15 0420 10/25/15 1311  NA 137 136 141 140 140  --   K 3.7 2.8* 2.9* 3.4* 4.0  --   CL 98* 101 107 108 111  --   CO2 _0 --   GLUCOSE 197* 146* 114* 87 119*  --   BUN _1 --   CREATININE 1.20* 1.09* 0.54 0.50 0.52  --   CALCIUM 9.3 8.4* 8.5* 8.3* 8.2*  --   MG  --   --   --  1.5*  --  1.6*   Liver Function Tests:  Recent Labs Lab 10/21/15 1905  AST 31  ALT 22  ALKPHOS 98  BILITOT 0.8  PROT 6.9  ALBUMIN 2.7*   No results for input(s): LIPASE, AMYLASE in the last 168 hours. No results for input(s): AMMONIA in the last 168 hours. CBC:  Recent Labs Lab 10/21/15 1905  10/22/15 0437 10/23/15 0820 10/24/15 0435 10/25/15 0420  WBC 15.6* 13.9* 14.4* 10.4 9.9  NEUTROABS 11.2*  --   --   --   --   HGB 12.9 11.6* 12.7 10.2* 9.9*  HCT 41.2 37.9 41.1 33.6* 33.1*  MCV 79.4 79.0 80.6 80.8 81.1  PLT 466* 406* 368 332 333   Cardiac Enzymes: No results for input(s): CKTOTAL, CKMB, CKMBINDEX, TROPONINI in the last 168 hours. BNP: BNP (last 3 results) No results for input(s): BNP in the last 8760 hours.  ProBNP (last 3 results) No results for input(s): PROBNP in the last 8760 hours.  CBG:  Recent Labs Lab 10/27/15 1212 10/27/15 1647 10/27/15 2229 10/28/15 0745 10/28/15 1137  GLUCAP 129* 186* 123* 92 96       Signed: Verneita Griffes, MD [facility] [phone]

## 2015-10-28 NOTE — Consult Note (Signed)
WOC wound follow up Wound type:  Sacral Pressure Injury: Unstagable, due to the presence of 80% slough, however palpable bone now in wound bed; 9cm x 7cm x 3cm with tunneling 2cm   DTPI left medial heel; unchanged  Unstageable Pressure Injury right posterior calf: unchanged  Measurement: see above Wound bed: see above Drainage (amount, consistency, odor) moderate, yellow, less odor Periwound: intact  Dressing procedure/placement/frequency: Assessment at the bedside with PT for hydrotherapy and Dr. Mahala MenghiniSamtani at bedside, continue hydrotherapy until DC.  Wound with less odor but still mostly necrotic, for this reason I will change to Dakin's solution at DC for hospice RN to change daily.  I have provided recipe for family below to make the solution. Will send one bottle from our pharmacy to start dressings changes.  This wound care will allow for continued chemical debridement and provide odor control.  Hospice RN can certainly reeval for change in topical care once the wound bed is clean.  Would recommend low air loss mattress at home for comfort and to offload current pressure injuries.  Continue Prevalon boots for offloading the heels.  Betadine only to the right right posterior leg wound and the left heel wound.   Comfort feeds with risk of aspiration.  Continue Foley for comfort and to avoid soilage of wound dressings.  Son questions safety for pressure injury for the ride [place] car [place].  Due to current wound status I am not sure that 2 hour ride will be that detrimental since the wound is already to the bone and mostly necrotic.  Would suggest not having patient up in a chair or recliner at home more than 1 hour at a time and certainly with transfer today would suggest transfer into hospital bed as soon as possible.       Dakin's Solution Recipe You will need: Clorox or similar household bleach (Sodium hypochlorite solution 5.25%). Don't use thick or scented bleach  Sodium bicarbonate  (baking soda)  Tap water  Cleaned/sterilized pan with lid, measuring cup and spoon, jar with lid. How To Make Dakin's Solution Wash your hands  Measure out 32 ounces (4 cups) of tap water. Pour into the clean pan.  Boil water for 15 minutes with the lid on the pan. Remove from heat.  Using the sterile measuring spoon, add  teaspoonful of baking soda to the boiled water.  You can make the solution in one of four strengths. Measure bleach according to the chart and add to the water also: 1. For full strength - add 3 oz. bleach or 95 ml. 2. For 1/2 strength - add 3 tbls. + 1/2 tsp. or 48 ml 3. For 1/4 strength - add 1 tbls. +2 tsp. or 24 ml. 4. For 1/8 strength - add 2 1/2 tsp. or 12-14 ml. Pour the solution into the sterile jar and put the lid on.  Make sure you clearly label the jar as Dakin's solution, also including the strength of solution and date of expiry  Cover the jar with aluminium foil or store it somewhere dark, to protect it from light. After opening the jar, throw away the remaining solution after 48 hours. An unopened jar can be stored for around a month if it is not opened, providing it is stored in the dark.  Discussed POC with patient's family and bedside nurse.  Re consult if needed, will not follow at this time. Thanks  Donyell Ding [employer] RN, CWOCN [phone])

## 2015-10-28 NOTE — Progress Notes (Signed)
Met with son Expressed support Getting 1 more hydrotherapy session prior to d/c home  Appreciate [place] providing Dakin's solution home recipe for Hospice/dressing instruction Please dress as per [place]   Otherwise stable to transport by private car to Home with Hospice  See ammended d/c summary dated 3/20   Verneita Griffes, MD [facility] ([phone]

## 2015-10-28 NOTE — Clinical Social Work Note (Signed)
CSW provided daughter with transportation cost for non-emergent EMS to [place]. Daughter states that [facility], she will transport the patient home herself. CSW signing off at this time.  Roddie McBryant Negar Sieler MSW, [place]CS[place], [place], [phone]

## 2015-10-28 NOTE — Care Management Important Message (Signed)
Important Message  Patient Details  Name: Lisa PotashDiana Burch MRN: [id number] Date of Birth: 12/16/1945   Medicare Important Message Given:  Yes    Oralia RudMegan P Deforrest Bogle 10/28/2015, 4:36 PM

## 2015-12-09 DEATH — deceased

## 2017-03-20 IMAGING — CT CT PELVIS W/O CM
2 of 3 series · 16 of 46 positions shown, 18 images · non-contrast
Comparison: Sacrococcygeal radiographs performed earlier today at

CLINICAL DATA: Worsening sacral decubitus ulceration, with oozing
and bleeding. Initial encounter.

EXAM:
CT PELVIS WITHOUT CONTRAST
TECHNIQUE: Multidetector CT imaging of the pelvis was performed following the
standard protocol without intravenous contrast.

[Series 2: pelvis w/o 5.0 · axial · non-contrast · 0.91mm/px · z∈[+820,+1070]mm · 13 of 58 slices shown, 15 images]
[im 4/58  soft-tissue]
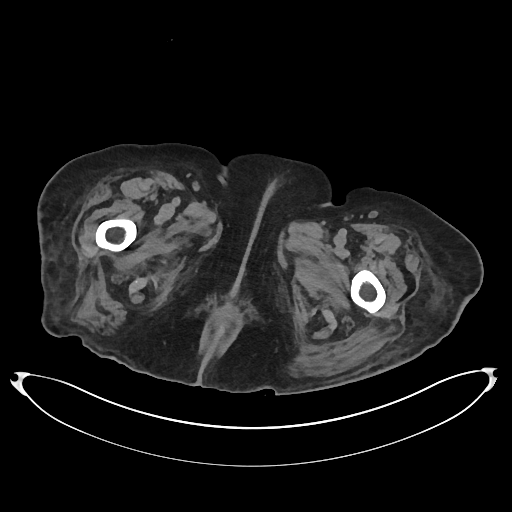
[im 4/58  bone]
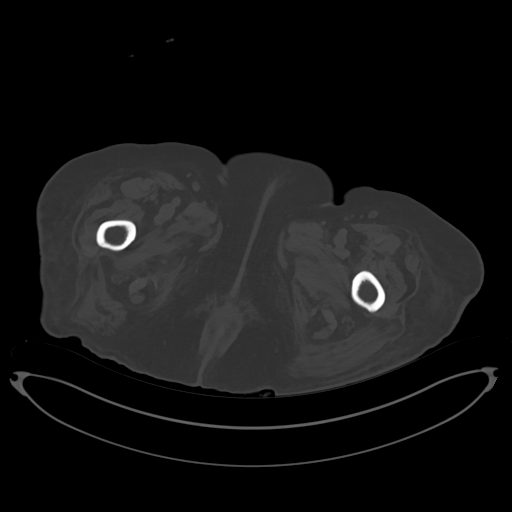
[im 8/58  soft-tissue]
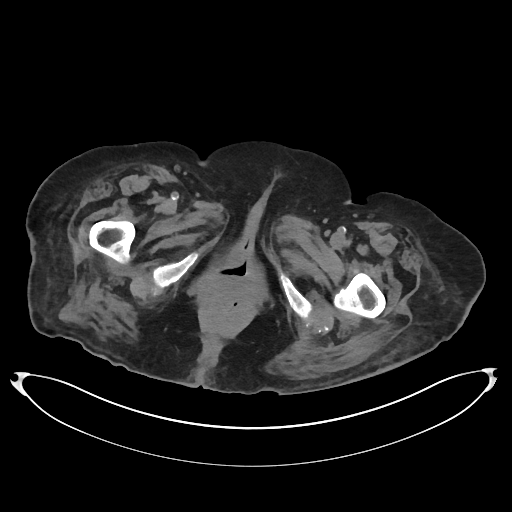
[im 12/58  soft-tissue]
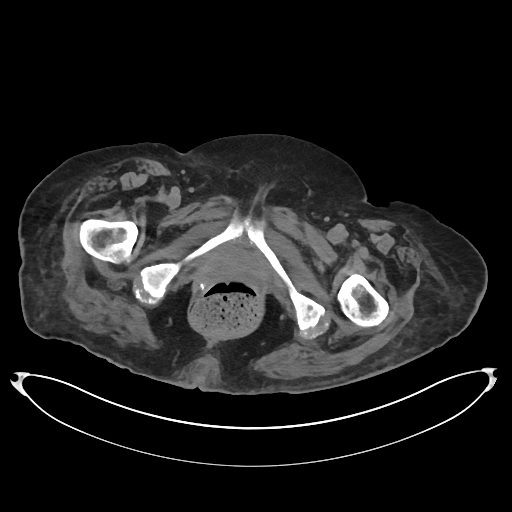
[im 17/58  soft-tissue]
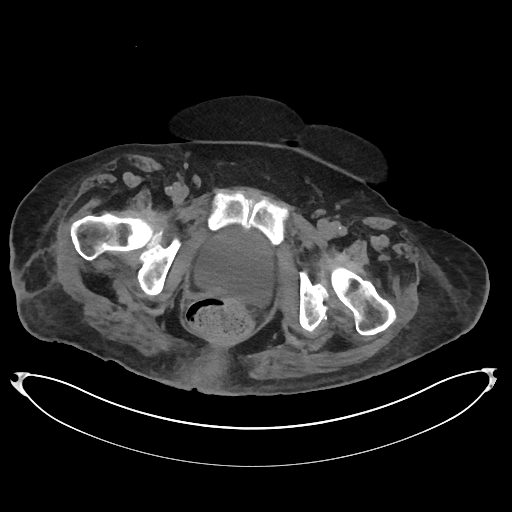
[im 21/58  soft-tissue]
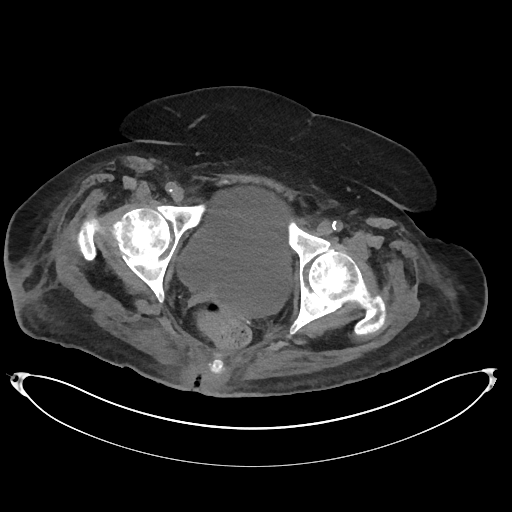
[im 24/58  soft-tissue]
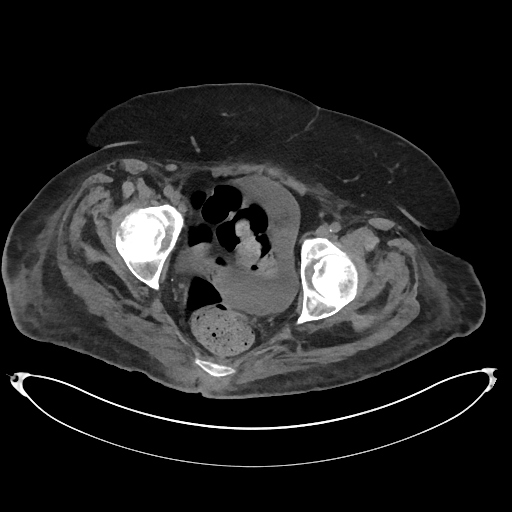
[im 30/58  soft-tissue]
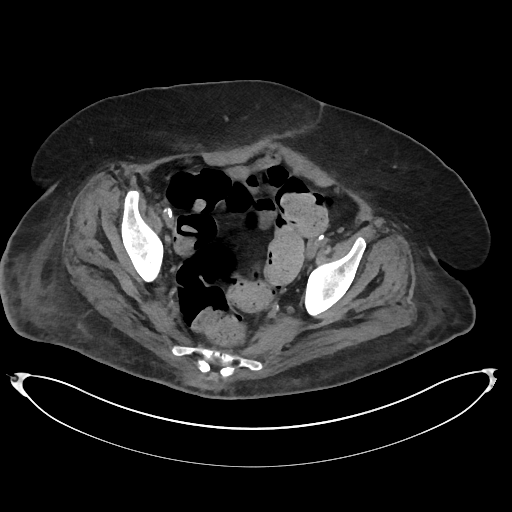
[im 34/58  soft-tissue]
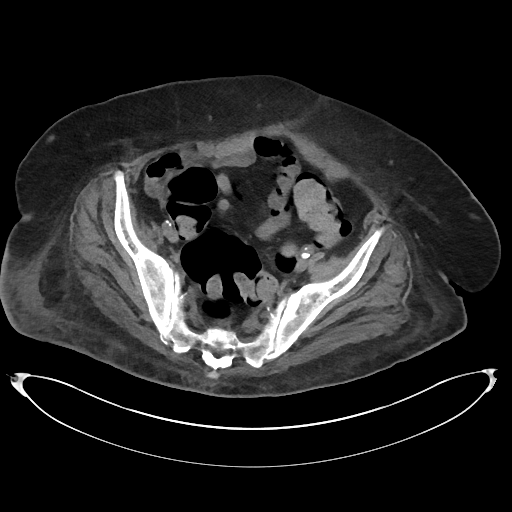
[im 37/58  soft-tissue]
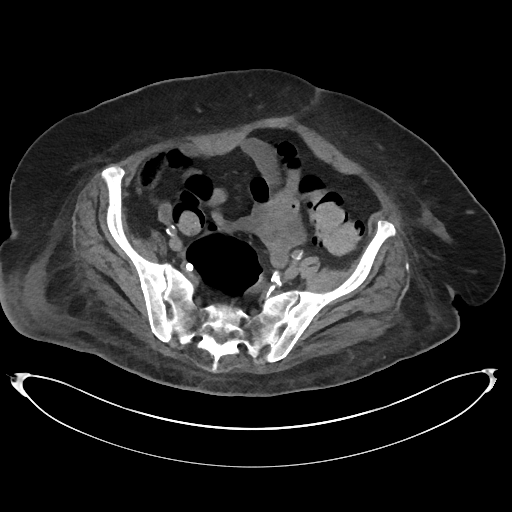
[im 37/58  bone]
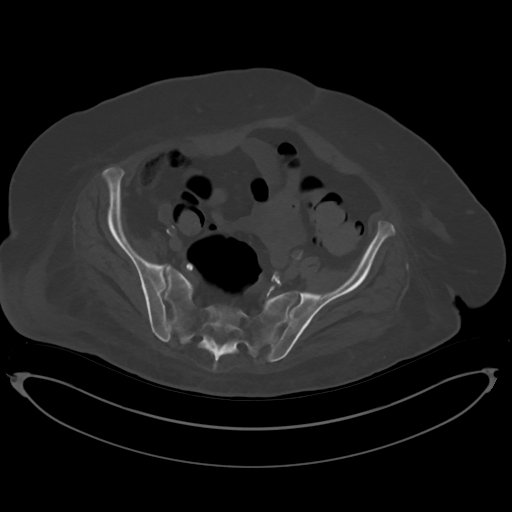
[im 41/58  soft-tissue]
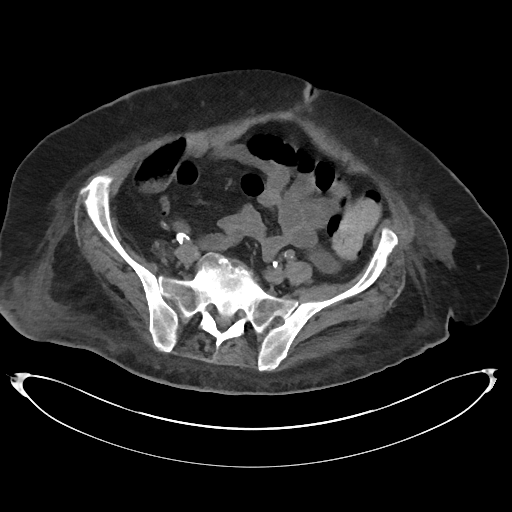
[im 46/58  soft-tissue]
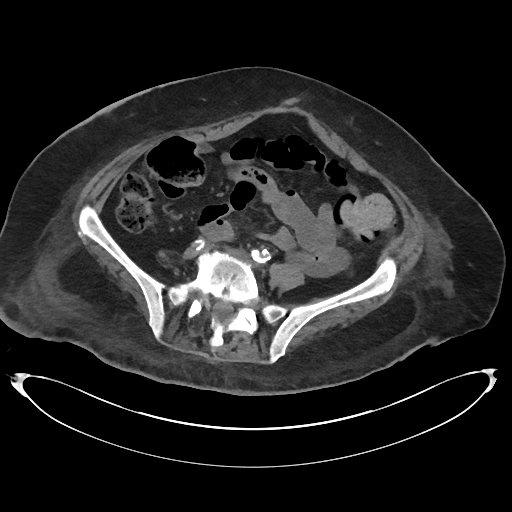
[im 50/58  soft-tissue]
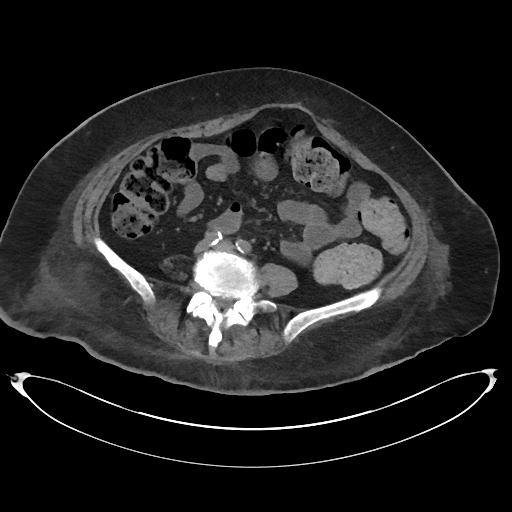
[im 54/58  soft-tissue]
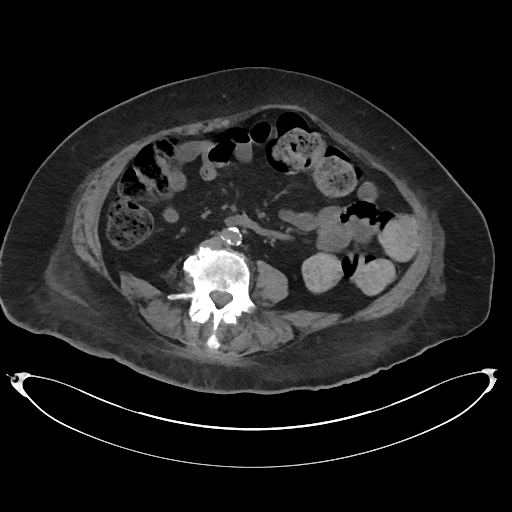

[Series 4: pelvis w/o 2.0 cor · coronal · non-contrast · 0.80mm/px · 3 of 138 slices shown]
[im 46/138  soft-tissue]
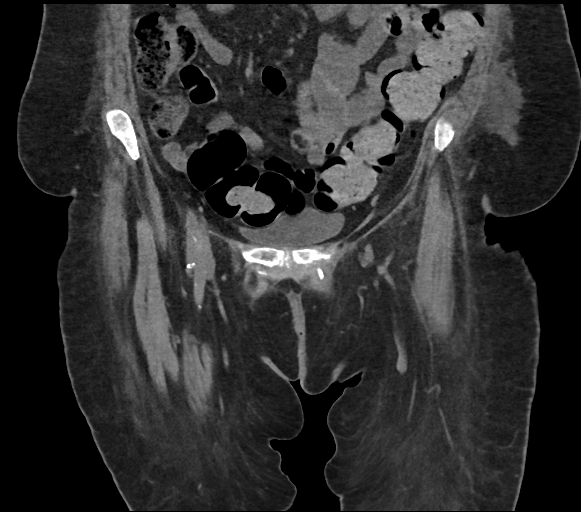
[im 61/138  soft-tissue]
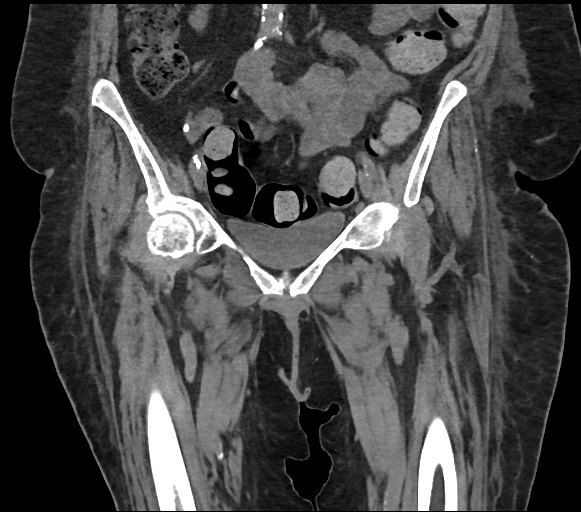
[im 77/138  soft-tissue]
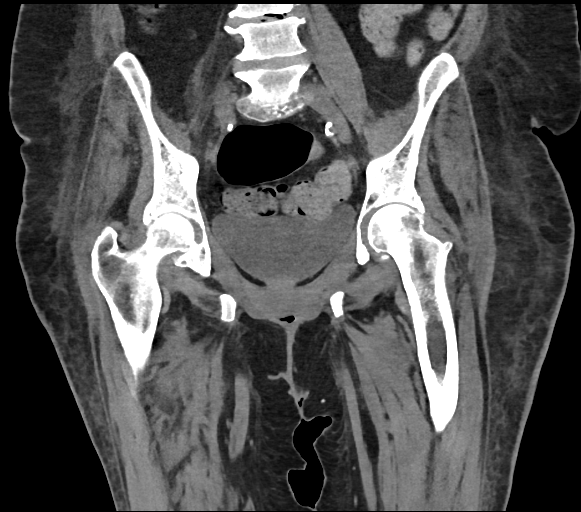

[16 of 46 positions shown; findings below may reference images not displayed]

FINDINGS: A focal decubitus ulceration is noted overlying the right side of
the coccyx, with surrounding soft tissue inflammation extending to
the coccyx. No definite osseous erosion is seen to suggest
osteomyelitis, though evaluation for osteomyelitis is limited on CT.

No abscess is seen. Additional diffuse soft tissue inflammation is
noted along the lateral abdominal wall and both flanks. Degenerative
change is noted along the lower lumbar spine, with loss of the disc
space at L5-S1. The sacroiliac joints are grossly unremarkable.

Visualized small and large bowel loops are grossly unremarkable. The
appendix remains normal in caliber, without evidence of
appendicitis. The bladder is mildly distended and grossly
unremarkable. The patient is status post hysterectomy. No suspicious
adnexal masses are seen. Diffuse calcification is noted along the
abdominal aorta and its branches.
IMPRESSION: 1. Focal decubitus ulceration overlies the right side of the coccyx,
with surrounding soft tissue inflammation extending to the coccyx.
No definite osseous erosion seen to suggest osteomyelitis, though
evaluation for osteomyelitis is limited on CT.
2. No evidence of abscess.
3. Additional diffuse soft tissue inflammation along the lateral
abdominal wall and both flanks.
4. Diffuse vascular calcifications seen.
5. Degenerative change at the lower lumbar spine.

## 2017-03-22 IMAGING — CR DG ABD PORTABLE 1V
1 series · 1 of 1 positions shown · non-contrast
Comparison: 10/22/2015

CLINICAL DATA: Unable to communicate.  Obstipation.  Diabetes.

EXAM:
PORTABLE ABDOMEN - 1 VIEW

[AP]
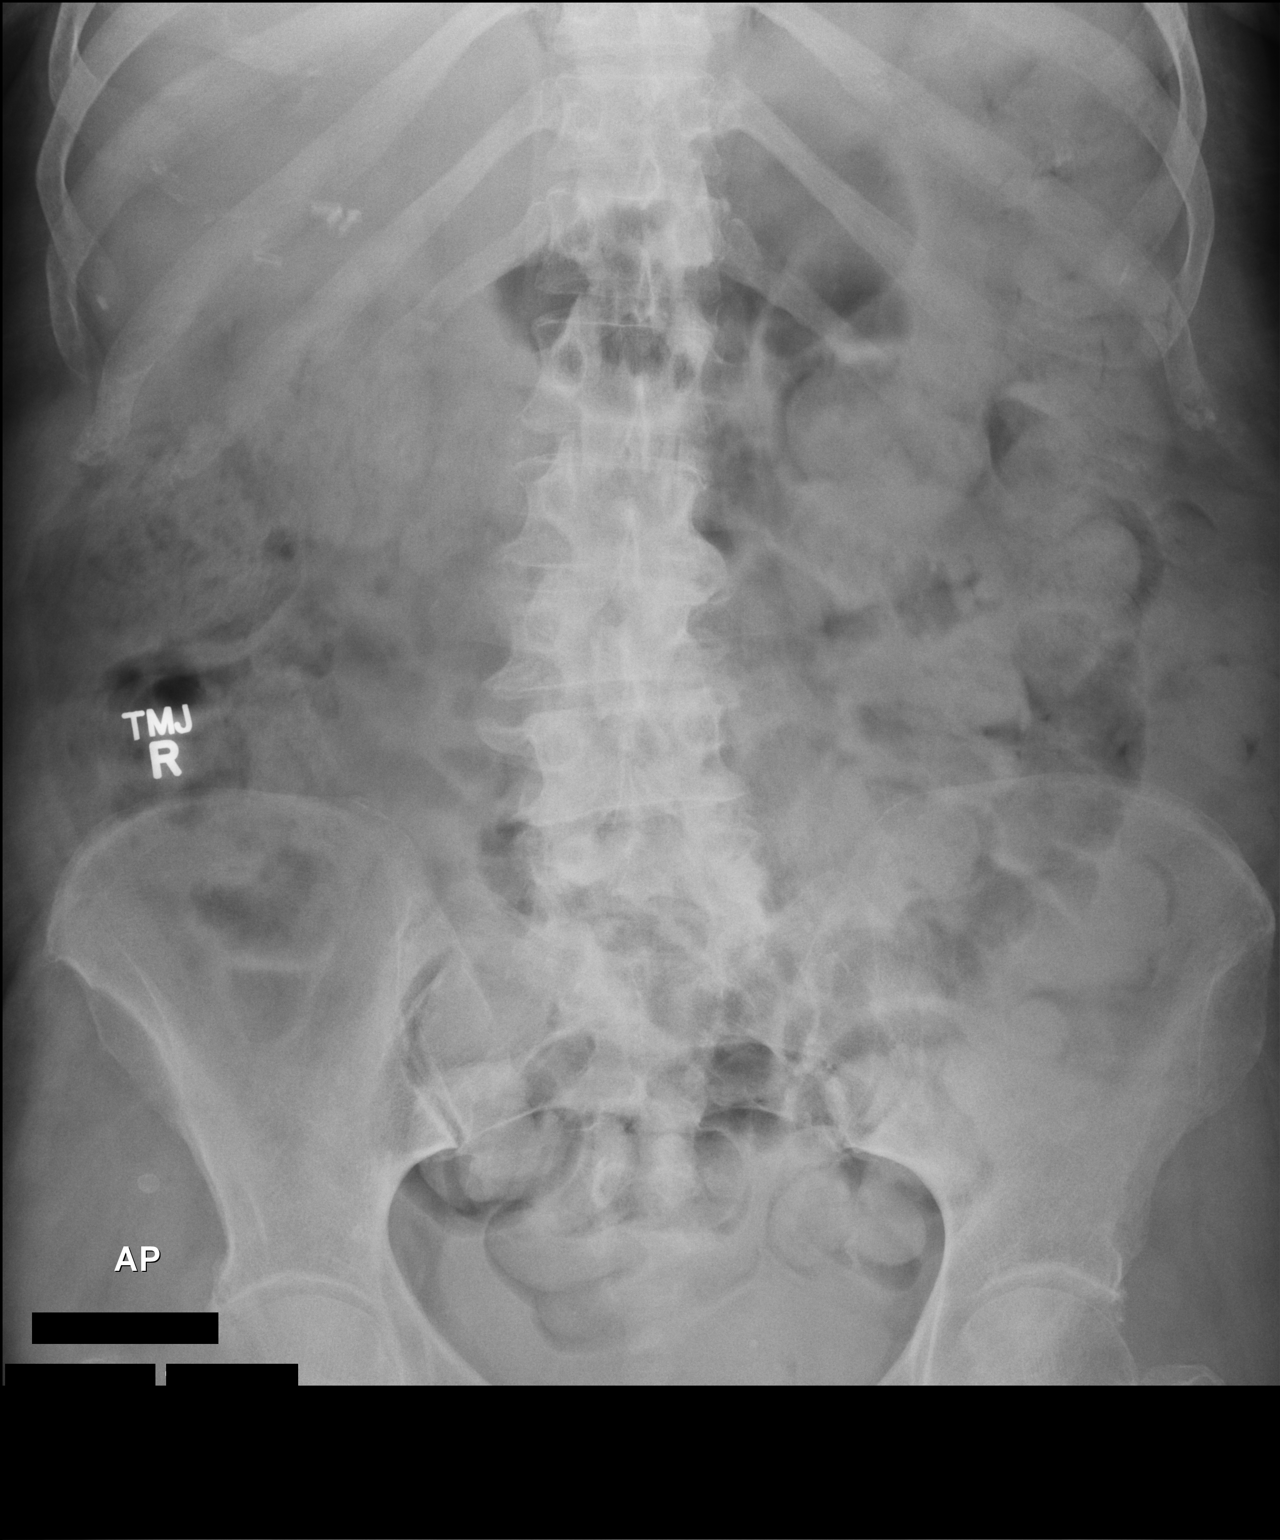

[1 of 1 positions shown; findings below may reference images not displayed]

FINDINGS: Prominent stool throughout the colon favors constipation.
Dextroconvex rotary scoliosis in the lumbar spine, mild. Lumbar
spondylosis.

Clips in the right upper quadrant likely from prior cholecystectomy.
Vascular calcifications noted.
IMPRESSION: 1.  Prominent stool throughout the colon favors constipation.
2. Lumbar spondylosis and scoliosis.
